# Patient Record
Sex: Male | Born: 1955 | Race: White | Hispanic: No | Marital: Married | Smoking: Former smoker
Health system: Southern US, Community
[De-identification: ages and names within clinical notes are randomized; demographics above are authoritative.]

## PROBLEM LIST (undated history)

## (undated) DIAGNOSIS — I10 Essential (primary) hypertension: Secondary | ICD-10-CM

## (undated) DIAGNOSIS — Z87442 Personal history of urinary calculi: Secondary | ICD-10-CM

## (undated) DIAGNOSIS — E7800 Pure hypercholesterolemia, unspecified: Secondary | ICD-10-CM

## (undated) DIAGNOSIS — N289 Disorder of kidney and ureter, unspecified: Secondary | ICD-10-CM

## (undated) DIAGNOSIS — G4730 Sleep apnea, unspecified: Secondary | ICD-10-CM

## (undated) HISTORY — PX: HERNIA REPAIR: SHX51

## (undated) HISTORY — PX: COLONOSCOPY: SHX174

## (undated) HISTORY — PX: BACK SURGERY: SHX140

---

## 2001-06-05 ENCOUNTER — Encounter: Payer: Self-pay | Admitting: Orthopaedic Surgery

## 2001-06-05 ENCOUNTER — Ambulatory Visit (HOSPITAL_COMMUNITY): Admission: RE | Admit: 2001-06-05 | Discharge: 2001-06-05 | Payer: Self-pay | Admitting: Orthopaedic Surgery

## 2001-06-16 ENCOUNTER — Encounter: Payer: Self-pay | Admitting: Neurosurgery

## 2001-06-17 ENCOUNTER — Inpatient Hospital Stay (HOSPITAL_COMMUNITY): Admission: RE | Admit: 2001-06-17 | Discharge: 2001-06-18 | Payer: Self-pay | Admitting: Neurosurgery

## 2001-06-23 ENCOUNTER — Encounter (HOSPITAL_COMMUNITY): Admission: RE | Admit: 2001-06-23 | Discharge: 2001-07-23 | Payer: Self-pay | Admitting: Neurosurgery

## 2005-11-27 ENCOUNTER — Ambulatory Visit (HOSPITAL_COMMUNITY): Admission: RE | Admit: 2005-11-27 | Discharge: 2005-11-27 | Payer: Self-pay | Admitting: Ophthalmology

## 2007-11-05 ENCOUNTER — Ambulatory Visit (HOSPITAL_COMMUNITY): Admission: RE | Admit: 2007-11-05 | Discharge: 2007-11-05 | Payer: Self-pay | Admitting: Ophthalmology

## 2011-01-12 ENCOUNTER — Emergency Department (HOSPITAL_BASED_OUTPATIENT_CLINIC_OR_DEPARTMENT_OTHER)
Admission: EM | Admit: 2011-01-12 | Discharge: 2011-01-12 | Disposition: A | Discharge status: Home / Self Care | Payer: No Typology Code available for payment source | Attending: Emergency Medicine | Admitting: Emergency Medicine

## 2011-01-12 ENCOUNTER — Emergency Department (INDEPENDENT_AMBULATORY_CARE_PROVIDER_SITE_OTHER): Payer: No Typology Code available for payment source

## 2011-01-12 DIAGNOSIS — M25539 Pain in unspecified wrist: Secondary | ICD-10-CM

## 2011-01-12 DIAGNOSIS — IMO0002 Reserved for concepts with insufficient information to code with codable children: Secondary | ICD-10-CM | POA: Insufficient documentation

## 2011-01-12 DIAGNOSIS — R079 Chest pain, unspecified: Secondary | ICD-10-CM

## 2011-01-12 DIAGNOSIS — Z79899 Other long term (current) drug therapy: Secondary | ICD-10-CM | POA: Insufficient documentation

## 2011-01-12 DIAGNOSIS — I10 Essential (primary) hypertension: Secondary | ICD-10-CM | POA: Insufficient documentation

## 2011-01-12 DIAGNOSIS — E7800 Pure hypercholesterolemia, unspecified: Secondary | ICD-10-CM | POA: Insufficient documentation

## 2011-01-12 DIAGNOSIS — Y92410 Unspecified street and highway as the place of occurrence of the external cause: Secondary | ICD-10-CM | POA: Insufficient documentation

## 2011-01-25 NOTE — Op Note (Signed)
[place]. [facility]  Patient:    Gregory Medina, Gregory Medina Visit Number: [id number] MRN: [id number]          Service Type: DSU Location: 3000 [phone] Attending Physician:  Danella Penton Dictated by:   Tanya Nones. Jeral Fruit, M.D. Proc. 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  PROCEDURES:  Right L5 hemilaminectomy, removal of large herniated disk at L4-5, total gross L4-5 diskectomies, decompression of the L5 and L4 nerve roots.  Microscope.  SURGEON:  Tanya Nones. Jeral Fruit, M.D.  ASSISTANTMena Goes. Franky Macho, M.D.  CLINICAL HISTORY:  Gregory Medina is a 55 year old gentleman who was seen in my office last week because of foot drop with a large herniated disk at the level of 4-5.  I told the patient about the need for surgery as soon as possible, but he declined surgery because of family problems.  He had no pain at that time, which I told him that that was a bad sign.  Nevertheless, he came yesterday in the office because he wanted surgery because of worsening of the weakness and pain.  The patient knew of the risks, such as infection, recurrence, and no improvement whatsoever, also the possibility of CSF leak and to deal with scar tissue from surgery that he had in the lumbar spine many years ago.  DESCRIPTION OF PROCEDURE:  The patient was taken to the OR, and the previous scar was removed after the skin was prepped with Betadine.  Incision was carried out in the midline until we were able to identify the area where he had surgery, which was 5-1.  We took an x-ray, which showed that indeed we were at the level of the lamina of L5.  Then because we knew by MRI the size of the herniated disk, we drilled the hemilamina of L5 and the upper part of L4.  The thecal sac was  retracted, but it was close to glued to the floor. Finally we were able to retract and identify the L4-5 space.  We opened the disk space, and we removed a large amount of degenerative disk.  Finally we were able to mobilize the thecal sac, and there was a large herniated free fragment compromising the takeoff of L5.  Having done total gross diskectomy, irrigation was achieved.  Valsalva maneuver was negative.  There was plenty of room for the L5 and L4 nerve roots.  Having done this, the area was closed with Vicryl and nylon.  The patient did well. Dictated by:   Tanya Nones. Jeral Fruit, M.D. Attending Physician:  Danella Penton DD:  06/16/01 TD:  06/17/01 Job: [phone] [id number]

## 2011-01-25 NOTE — Op Note (Signed)
NAMEEQUAN, COGBILL NO.:  [id number]   MEDICAL RECORD NO.:  [id number]          PATIENT TYPE:  AMB   LOCATION:  DAY                           FACILITY:  [facility]   PHYSICIAN:  Trish Fountain, MD    DATE OF BIRTH:  [birth year]   DATE OF PROCEDURE:  12/25/2005  DATE OF DISCHARGE:  11/27/2005                                 OPERATIVE REPORT   PREOPERATIVE DIAGNOSIS:  Cataract, right eye.   POSTOPERATIVE DIAGNOSIS:  Cataract, right eye.   SURGERY:  Kelman phacoemulsification, right eye, with posterior chamber  intraocular lens, right eye.   ANESTHESIA:  MAC with topical anesthesia of the right eye.   SURGEON:  Trish Fountain, MD   SPECIMENS:  None.   COMPLICATIONS:  None.   LENS MODEL:  AMO model ZA9003,  21.0  diopter lens, serial # [id number].   HISTORY:  This is a 55 year old male who has slowly progressive decrease in  vision in the right eye.   DESCRIPTION OF PROCEDURE:  In the preoperative area, the patient had  Cyclogyl and Neo-Synephrine drops in the right eye in order to dilate the  eye along with Tetracaine to help anesthetize the eye.  Once the patient's  right eye was dilated, the patient was taken to the operating room and  prepped.  The right eye was prepped and draped in the usual sterile manner.  A lid speculum was placed in the right eye, and 2% Xylocaine jelly was  placed in the right eye as well.  A paracentesis was made through clear  cornea at the limbus at approximately the 11 o'clock position of the right  eye.  Nonpreserved Xylocaine 1% 1 cc was placed into the anterior chamber  for one minute.  Viscoat was then used to fill the anterior chamber.  Using  a 2.75 mm blade at the 9 o'clock position, an incision into the anterior  chamber was made through clear cornea near the limbus.  Viscoat was again  used to reform the anterior chamber.  A 25 gauge bent capsulotomy needle was  used to begin the capsulorrhexis through the anterior  capsule of the lens.  Utrata forceps were used to make a 360 degree anterior capsulorrhexis.  A  Chang 27 gauge irrigating cannula was used to hydrodissect and  hydrodelineate the nucleus.  Once hydrodissection and hydrodelineation was  carried out, [facility] phacoemulsification was used to make a deep groove in  the lens nucleus.  The lens was rotated 360 degrees and divided into four  quadrants using deep grooves made by phacoemulsification with the [facility]  phacoemulsification tip.  The nucleus was then divided using the phaco tip  and the nucleus manipulator.  The nuclear quadrants were then removed using  phacoemulsification.  The irrigation aspiration was then used to remove the  remainder of the cortex.  A olive-tipped capsule polisher was used to clean  the posterior capsule.  Not all of the posterior capsular opacity could be  removed with the capsular polisher.  The anterior chamber and posterior  capsule was filled with Provisc, and  the 9 o'clock position incision was  slightly widened, using the same 2.75 mm blade that was initially used to  make the incision.  An intraocular lens was placed in the shooter, and this  was placed in the eye, followed by placement of the trailing haptic into the  posterior capsule, using the Kugelan.  Irrigation/aspiration was then used  to remove Provisc from the anterior chamber and the posterior capsule.  BSS  on a syringe was then used to hydrate the cornea at the 9 o'clock incision  site.  The incision site was then checked for water tightness, using a Weck-  cel.  Half-strength Betadine solution was placed, 1 drop, in the inner  canthus, and 1 drop in the outer canthus.  After one minute, this was rinsed  from the eye.  Drops were placed in the eye, Vigamox, followed by Nevanac  followed by Econopred.  A shield was placed over the patient's right eye,  and the patient was sent to the recovery room in satisfactory condition.      Trish Fountain, MD  Electronically Signed     PVK/MEDQ  D:  12/25/2005  T:  12/26/2005  Job:  [id number]

## 2011-01-25 NOTE — Discharge Summary (Signed)
[place]. [facility]  Patient:    Gregory Medina, ALA Visit Number: [id number] MRN: [id number]          Service Type: MED Location: [phone] Attending Physician:  Danella Penton Admit Date:  06/16/2001 Discharge Date: 06/18/2001                             Discharge Summary  ADMISSION DIAGNOSIS:  Right lumbar 4, lumbar 5 herniated disk with footdrop.  DISCHARGE DIAGNOSIS:  Right lumbar 4, lumbar 5 herniated disk with footdrop.  CLINICAL HISTORY:  The patient is admitted because of back and right leg pain associated a footdrop.  I saw him a week ago, but he declined admission because of family problems.  Patient had previous surgery by somebody else many years ago.  COURSE IN THE HOSPITAL:  The patient was taken to surgery where an L4, L5 diskectomy was performed.  The patients pain improved and the weakness improved slightly.  Twenty-four hours later he developed fever.  He was treated with a hand held nebulizer and right now he is afebrile.  That is the main reason he was kept for 48 hours.  We were informed that the chest x-ray showed a shadow in the right apex and he is going to have a chest CT scan as an outpatient.  CONDITION ON DISCHARGE:  Improved.  MEDICATIONS:  Percocet and diazepam.  FOLLOW-UP:  I will see him in my office next week.  He will have an appointment to be seen by physical therapy for therapy of the right foot as well as an outpatient chest CT scan.  DIET:  Regular. Attending Physician:  Danella Penton DD:  06/18/01 TD:  06/18/01 Job: [phone]

## 2011-01-25 NOTE — H&P (Signed)
[place]. [facility]  Patient:    Gregory Medina, Gregory Medina Visit Number: [id number] MRN: [id number]          Service Type: DSU Location: 3000 [phone] Attending Physician:  Danella Penton Dictated by:   Tanya Nones. Jeral Fruit, M.D. Admit Date:  06/16/2001                           History and Physical  CHIEF COMPLAINT: Mr. Lefferts is a patient who was seen by me as an emergency in my office about a week ago because of sudden onset of weakness of the right foot.  HISTORY OF PRESENT ILLNESS: The patient had surgery about 13 years ago in [place], [place], and he did really well.  In August 2002 he was water skiing and suddenly developed back pain and later on pain and weakness in the right foot.  He had an MRI and was sent to [place] for evaluation.  PAST MEDICAL HISTORY:  1. Surgery at the level of 5-1 in 1988.  2. Epidural injection in 1990.  ALLERGIES: MOTRIN.  SOCIAL HISTORY: He drinks socially.  He does not smoke.  FAMILY HISTORY: Negative.  REVIEW OF SYSTEMS: Only positive for back pain and weakness.  PHYSICAL EXAMINATION:  GENERAL: The patient came to my office and was limping from the right leg.  HEENT: Normal.  NECK: Normal.  LUNGS: Clear.  CARDIAC: Heart sounds normal.  ABDOMEN: Normal.  EXTREMITIES: Normal pulses.  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  NEUROLOGIC: Mental status normal.  Cranial nerves normal.  Strength is 5/5 except in the left foot, where he has 4/5 plantar flexion and 0-1/5 dorsiflexion.  There is no atrophy or fasciculation.  Sensation normal. Reflexes symmetrical.  LABORATORY DATA: MRI showed he has a large herniated disk at the level at 4-5 with displacement of the thecal sac on the right side.  There are degenerative changes at the level of 5-1.  CLINICAL IMPRESSION: Foot drop on the right side associated with herniated disk at 4-5 on the right.  PLAN: I talked with the  patient about the meaning of the foot drop. Nevertheless, the patient decided to wait for surgery.  He is being taken today for surgery and he knows about the risks such as infection, no improvement whatsoever of the weakness, need for further surgery, and CSF leak.Dictated by:   Tanya Nones. Jeral Fruit, M.D. Attending Physician:  Danella Penton DD:  06/16/01 TD:  06/16/01 Job: [phone] [id number]

## 2011-05-31 LAB — BASIC METABOLIC PANEL
BUN: 15
CO2: 30
Calcium: 9.1
Chloride: 106
Creatinine, Ser: 0.85
GFR calc Af Amer: >60
GFR calc non Af Amer: >60
Glucose, Bld: 101 — ABNORMAL HIGH
Potassium: 4.9
Sodium: 140

## 2012-10-19 ENCOUNTER — Emergency Department (HOSPITAL_COMMUNITY)
Admission: EM | Admit: 2012-10-19 | Discharge: 2012-10-19 | Disposition: A | Discharge status: Home / Self Care | Payer: Managed Care, Other (non HMO) | Attending: Emergency Medicine | Admitting: Emergency Medicine

## 2012-10-19 ENCOUNTER — Encounter (HOSPITAL_COMMUNITY): Payer: Self-pay | Admitting: *Deleted

## 2012-10-19 ENCOUNTER — Emergency Department (HOSPITAL_COMMUNITY): Payer: Managed Care, Other (non HMO)

## 2012-10-19 DIAGNOSIS — R112 Nausea with vomiting, unspecified: Secondary | ICD-10-CM | POA: Insufficient documentation

## 2012-10-19 DIAGNOSIS — Z87442 Personal history of urinary calculi: Secondary | ICD-10-CM | POA: Insufficient documentation

## 2012-10-19 DIAGNOSIS — N201 Calculus of ureter: Secondary | ICD-10-CM | POA: Insufficient documentation

## 2012-10-19 DIAGNOSIS — Z79899 Other long term (current) drug therapy: Secondary | ICD-10-CM | POA: Insufficient documentation

## 2012-10-19 DIAGNOSIS — Z7982 Long term (current) use of aspirin: Secondary | ICD-10-CM | POA: Insufficient documentation

## 2012-10-19 DIAGNOSIS — N23 Unspecified renal colic: Secondary | ICD-10-CM | POA: Insufficient documentation

## 2012-10-19 DIAGNOSIS — I10 Essential (primary) hypertension: Secondary | ICD-10-CM | POA: Insufficient documentation

## 2012-10-19 DIAGNOSIS — Z8719 Personal history of other diseases of the digestive system: Secondary | ICD-10-CM | POA: Insufficient documentation

## 2012-10-19 HISTORY — DX: Essential (primary) hypertension: I10

## 2012-10-19 HISTORY — DX: Disorder of kidney and ureter, unspecified: N28.9

## 2012-10-19 LAB — URINALYSIS, ROUTINE W REFLEX MICROSCOPIC
Glucose, UA: 100 mg/dL — AB
Ketones, ur: NEGATIVE mg/dL
Leukocytes, UA: NEGATIVE
pH: 7.5 (ref 5.0–8.0)

## 2012-10-19 LAB — URINE MICROSCOPIC-ADD ON

## 2012-10-19 MED ORDER — ONDANSETRON HCL 4 MG/2ML IJ SOLN
4.0000 mg | Freq: Once | INTRAMUSCULAR | Status: AC
  Administered 2012-10-19: 4 mg via INTRAVENOUS
  Filled 2012-10-19: qty 2

## 2012-10-19 MED ORDER — ONDANSETRON HCL 4 MG/2ML IJ SOLN
INTRAMUSCULAR | Status: AC
  Administered 2012-10-19: 4 mg
  Filled 2012-10-19: qty 2

## 2012-10-19 MED ORDER — OXYCODONE-ACETAMINOPHEN 5-325 MG PO TABS: 1.0000 | ORAL_TABLET | ORAL | Status: DC | PRN

## 2012-10-19 MED ORDER — HYDROMORPHONE HCL PF 1 MG/ML IJ SOLN
1.0000 mg | Freq: Once | INTRAMUSCULAR | Status: AC
  Administered 2012-10-19: 1 mg via INTRAVENOUS
  Filled 2012-10-19: qty 1

## 2012-10-19 MED ORDER — HYDROMORPHONE HCL PF 1 MG/ML IJ SOLN
INTRAMUSCULAR | Status: AC
  Administered 2012-10-19: 1 mg
  Filled 2012-10-19: qty 1

## 2012-10-19 MED ORDER — KETOROLAC TROMETHAMINE 30 MG/ML IJ SOLN
30.0000 mg | Freq: Once | INTRAMUSCULAR | Status: AC
  Administered 2012-10-19: 30 mg via INTRAVENOUS
  Filled 2012-10-19: qty 1

## 2012-10-19 MED ORDER — TAMSULOSIN HCL 0.4 MG PO CAPS: ORAL_CAPSULE | ORAL | Status: DC

## 2012-10-19 MED ORDER — TAMSULOSIN HCL 0.4 MG PO CAPS
ORAL_CAPSULE | ORAL | Status: AC
  Filled 2012-10-19: qty 1

## 2012-10-19 MED ORDER — TAMSULOSIN HCL 0.4 MG PO CAPS
0.4000 mg | ORAL_CAPSULE | Freq: Once | ORAL | Status: AC
  Administered 2012-10-19: 0.4 mg via ORAL
  Filled 2012-10-19: qty 1

## 2012-10-19 MED ORDER — SODIUM CHLORIDE 0.9 % IV SOLN
INTRAVENOUS | Status: DC
  Administered 2012-10-19: 05:00:00 via INTRAVENOUS

## 2012-10-19 NOTE — ED Notes (Signed)
Pt c/o left sided flank pain, and n/v. Pt has had a kidney stone before.

## 2012-10-19 NOTE — ED Provider Notes (Signed)
History     CSN: [id number]  Arrival date & time 10/19/12  4098   First MD Initiated Contact with Patient 10/19/12 0505      Chief Complaint  Patient presents with  . Flank Pain  . Nausea  . Emesis    (Consider location/radiation/quality/duration/timing/severity/associated sxs/prior treatment) HPI Comments: Gregory Medina is a 57 y.o. Male who is here for evaluation of, sudden onset of severe, left flank, radiating to left abdomen,  pain. The pain is pressure-like. It is similar to when he last had a kidney stone, 15 years ago. He has not tried a medication for the problem. There are no associated symptoms. The pain is unrelenting since it started. There are no known modifying factors  Patient is a 57 y.o. male presenting with flank pain and vomiting. The history is provided by the patient.  Flank Pain  Emesis   Past Medical History  Diagnosis Date  . Hypertension   . Renal disorder     kidney stone    Past Surgical History  Procedure Laterality Date  . Hernia repair    . Back surgery      History reviewed. No pertinent family history.  History  Substance Use Topics  . Smoking status: Never Smoker   . Smokeless tobacco: Not on file  . Alcohol Use: Yes     Comment: occasionally      Review of Systems  Gastrointestinal: Positive for vomiting.  Genitourinary: Positive for flank pain.  All other systems reviewed and are negative.    Allergies  Review of patient's allergies indicates no known allergies.  Home Medications   Current Outpatient Rx  Name  Route  Sig  Dispense  Refill  . aspirin 81 MG tablet   Oral   Take 81 mg by mouth daily.         . cyclobenzaprine (FLEXERIL) 10 MG tablet   Oral   Take 10 mg by mouth as needed for muscle spasms.         . fish oil-omega-3 fatty acids 1000 MG capsule   Oral   Take 1 g by mouth daily.         Marland Kitchen lisinopril (PRINIVIL,ZESTRIL) 20 MG tablet   Oral   Take 20 mg by mouth daily.         .  metoprolol succinate (TOPROL-XL) 100 MG 24 hr tablet   Oral   Take 100 mg by mouth daily. Take with or immediately following a meal.         . Multiple Vitamin (MULTIVITAMIN) tablet   Oral   Take 1 tablet by mouth daily.         . pravastatin (PRAVACHOL) 20 MG tablet   Oral   Take 20 mg by mouth at bedtime.         Marland Kitchen oxyCODONE-acetaminophen (PERCOCET) 5-325 MG per tablet   Oral   Take 1 tablet by mouth every 4 (four) hours as needed for pain.   20 tablet   0   . Tamsulosin HCl (FLOMAX) 0.4 MG CAPS      1 q HS to aid stone passage   7 capsule   0     BP 125/72  Pulse 60  Temp(Src) 97.6 F (36.4 C) (Oral)  Resp 12  Ht 5\' 10"  (1.778 m)  Wt 190 lb (86.183 kg)  BMI 27.26 kg/m2  SpO2 97%  Physical Exam  Nursing note and vitals reviewed. Constitutional: He is oriented to person, place, and  time. He appears well-developed and well-nourished.  HENT:  Head: Normocephalic and atraumatic.  Right Ear: External ear normal.  Left Ear: External ear normal.  Eyes: Conjunctivae and EOM are normal. Pupils are equal, round, and reactive to light.  Neck: Normal range of motion and phonation normal. Neck supple.  Cardiovascular: Normal rate, regular rhythm, normal heart sounds and intact distal pulses.   Pulmonary/Chest: Effort normal and breath sounds normal. He exhibits no bony tenderness.  Abdominal: Soft. Normal appearance. There is no tenderness.  Musculoskeletal: Normal range of motion.  Neurological: He is alert and oriented to person, place, and time. He has normal strength. No cranial nerve deficit or sensory deficit. He exhibits normal muscle tone. Coordination normal.  Skin: Skin is warm, dry and intact.  Psychiatric: He has a normal mood and affect. His behavior is normal. Judgment and thought content normal.    ED Course  Procedures (including critical care time)  Initial treatment, IV, Dilaudid, and Zofran  He had recurrence of pain. He required, a second dose  of Dilaudid; he became mildly hypoxic to 85% after the second, Dilaudid dose. Nasal cannula oxygen improved, his oxygen saturation to 94%  CT images reviewed by me dictating a small distal Left ureteral stone. IV, Toradol, and oral Flomax, ordered    Labs Reviewed  URINALYSIS, ROUTINE W REFLEX MICROSCOPIC - Abnormal; Notable for the following:    Glucose, UA 100 (*)    Hgb urine dipstick LARGE (*)    All other components within normal limits  URINE MICROSCOPIC-ADD ON   Ct Abdomen Pelvis Wo Contrast  10/19/2012  *RADIOLOGY REPORT*  Clinical Data: Left-sided flank pain for 3 hours.  Nausea and vomiting.  CT ABDOMEN AND PELVIS WITHOUT CONTRAST  Technique:  Multidetector CT imaging of the abdomen and pelvis was performed following the standard protocol without intravenous contrast.  Comparison: None.  Findings: The visualized lung bases are clear.  The liver and spleen are unremarkable in appearance.  The gallbladder is within normal limits.  The pancreas and adrenal glands are unremarkable.  There is mild left-sided hydronephrosis, with left-sided perinephric stranding and fluid, and prominence of the left ureter along its entire course.  An obstructing 3 mm stone is noted distally, just proximal to the left vesicoureteral junction.  Slight irregularity involving the lower pole of the right kidney may reflect a small angiomyolipoma.  The right kidney is otherwise unremarkable in appearance.  An exophytic 2.2 cm cyst is noted arising at the upper pole of the left kidney.  A 2 mm stone is noted near the interpole region of the left kidney.  No free fluid is identified.  The small bowel is unremarkable in appearance.  A tiny hiatal hernia is noted; the stomach is otherwise unremarkable.  No acute vascular abnormalities are seen. Mild scattered calcification is noted along the distal abdominal aorta and its branches.  The appendix is normal in caliber, without evidence for appendicitis.  The colon is  unremarkable in appearance.  The bladder is decompressed and not well assessed.  A small right inguinal hernia is noted, containing only fat.  The prostate is borderline normal in size, with scattered calcification.  No inguinal lymphadenopathy is seen.  No acute osseous abnormalities are identified.  Vacuum phenomenon and disc space narrowing are noted at L5-S1.  IMPRESSION:  1.  Mild left-sided hydronephrosis, with an obstructing 3 mm distal ureteral stone seen, just proximal to the left vesicoureteral junction. 2.  Question of small right-sided renal angiomyolipoma.  Left renal cyst noted.  2 mm stone near the interpole region of the left kidney.  3.  Tiny hiatal hernia seen. 4.  Mild scattered calcification along the distal abdominal aorta and its branches. 5.  Small right inguinal hernia, containing only fat.   Original Report Authenticated By: Tonia Ghent, M.D.    Nursing notes, applicable records and vitals reviewed.  Radiologic Images/Reports reviewed.   1. Ureteral stone   2. Ureteral colic       MDM  Partially obstructing left ureter stone with high likelihood of passage, within the next 72 hours.      Plan: Home Medications- Percocet, Flomax; Home Treatments- strain urine; Recommended follow up- urology One week    Flint Melter, MD 10/19/12 [id number]

## 2012-10-19 NOTE — ED Notes (Signed)
Patient oxygen saturation dropped to 85% on room air. Patient placed on 2 liters of oxygen via nasal canula.

## 2012-11-12 ENCOUNTER — Other Ambulatory Visit (HOSPITAL_COMMUNITY): Payer: Self-pay | Admitting: Family Medicine

## 2012-11-12 DIAGNOSIS — K769 Liver disease, unspecified: Secondary | ICD-10-CM

## 2012-11-13 ENCOUNTER — Other Ambulatory Visit (HOSPITAL_COMMUNITY): Payer: Managed Care, Other (non HMO)

## 2012-11-17 ENCOUNTER — Ambulatory Visit (HOSPITAL_COMMUNITY)
Admission: RE | Admit: 2012-11-17 | Discharge: 2012-11-17 | Disposition: A | Discharge status: Home / Self Care | Payer: Managed Care, Other (non HMO) | Source: Ambulatory Visit | Attending: Family Medicine | Admitting: Family Medicine

## 2012-11-17 DIAGNOSIS — K7689 Other specified diseases of liver: Secondary | ICD-10-CM | POA: Insufficient documentation

## 2012-11-17 DIAGNOSIS — Q619 Cystic kidney disease, unspecified: Secondary | ICD-10-CM | POA: Insufficient documentation

## 2012-11-17 DIAGNOSIS — K769 Liver disease, unspecified: Secondary | ICD-10-CM

## 2012-11-17 LAB — POCT I-STAT, CHEM 8
Chloride: 106 mEq/L (ref 96–112)
HCT: 43 % (ref 39.0–52.0)
Potassium: 3.8 mEq/L (ref 3.5–5.1)

## 2012-11-17 MED ORDER — GADOBENATE DIMEGLUMINE 529 MG/ML IV SOLN
18.0000 mL | Freq: Once | INTRAVENOUS | Status: AC | PRN
  Administered 2012-11-17: 18 mL via INTRAVENOUS

## 2012-11-17 NOTE — Progress Notes (Signed)
Blood sample obtained from right arm IV for Creatnine level.  

## 2014-04-26 IMAGING — CT CT ABD-PELV W/O CM
2 of 3 series · 15 of 46 positions shown, 17 images · non-contrast
Comparison: None.

***ADDENDUM*** CREATED: 11/03/2012 [DATE]

Omitted from initial dictation is presence of a 2.7 x 1.8 x 2.1 cm
diameter low attenuation lesion within the posterior aspect of the
right lobe liver image 34.  This lesion measures 33 HU attenuation
and is indeterminate in etiology.  Further characterization of this
lesion by MR imaging with/without contrast is recommended.
CLINICAL DATA: Left-sided flank pain for 3 hours.  Nausea and
vomiting.
CT ABDOMEN AND PELVIS WITHOUT CONTRAST
TECHNIQUE: Multidetector CT imaging of the abdomen and pelvis was
performed following the standard protocol without intravenous
contrast.

[Series 2: standard/full over (age)lbs 5.0 · axial · 0.68mm/px · z∈[-648,-183]mm · 12 of 107 slices shown, 14 images]
[im 7/107  soft-tissue]
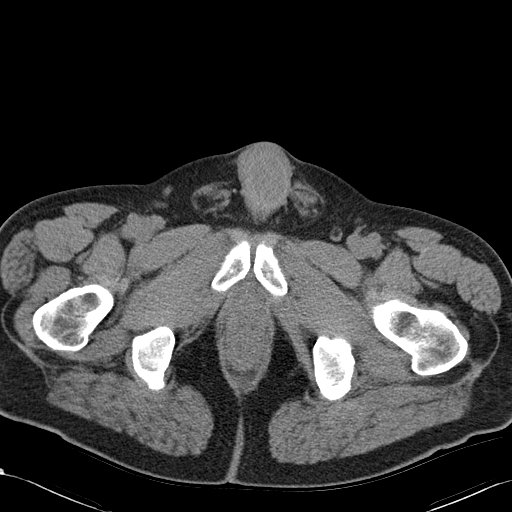
[im 7/107  bone]
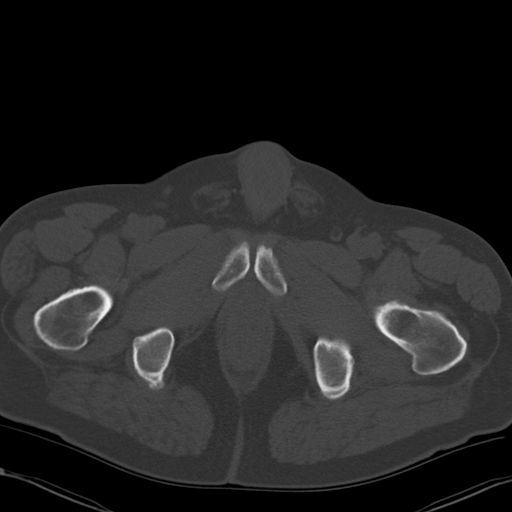
[im 14/107  soft-tissue]
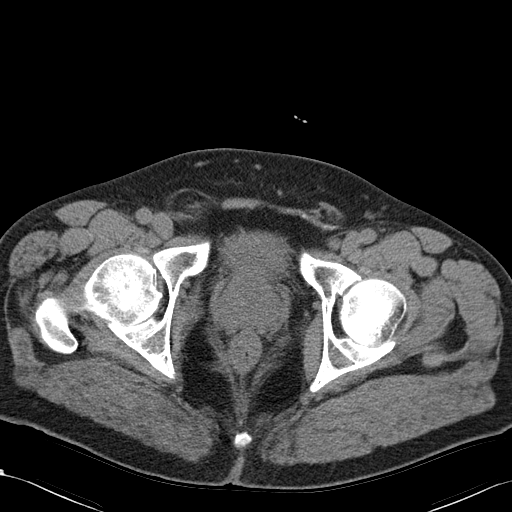
[im 24/107  soft-tissue]
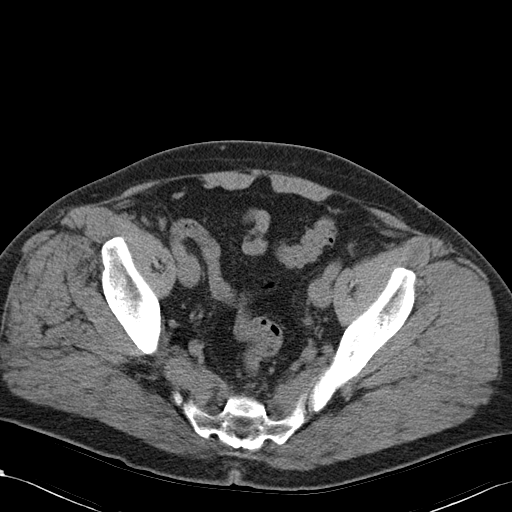
[im 31/107  soft-tissue]
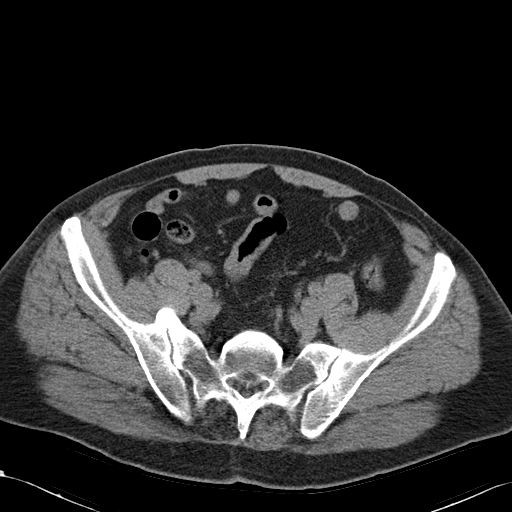
[im 42/107  soft-tissue]
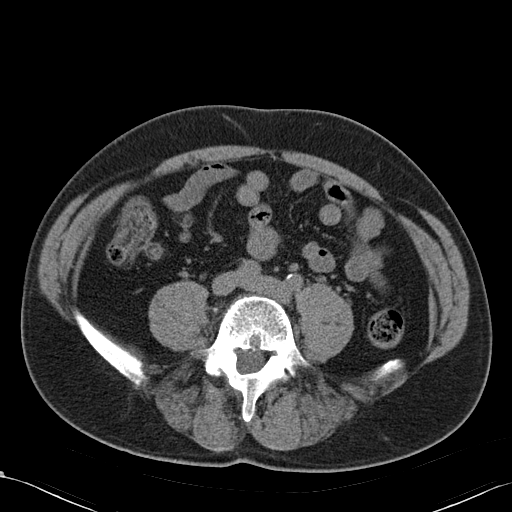
[im 48/107  soft-tissue]
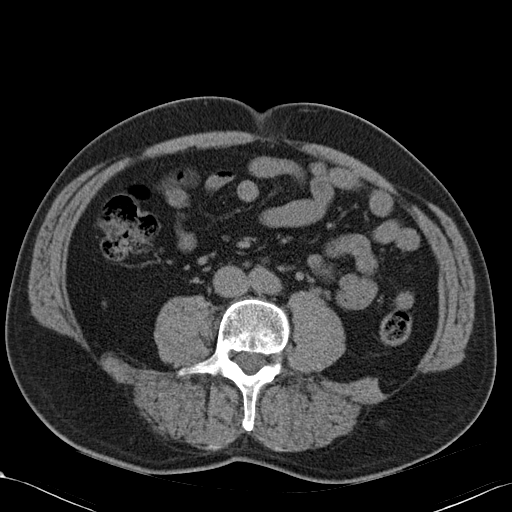
[im 59/107  soft-tissue]
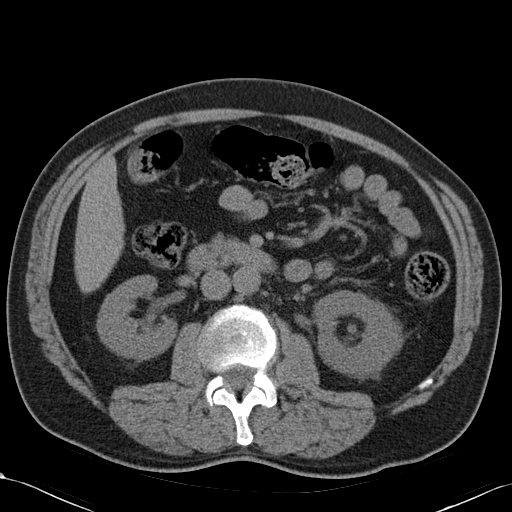
[im 65/107  soft-tissue]
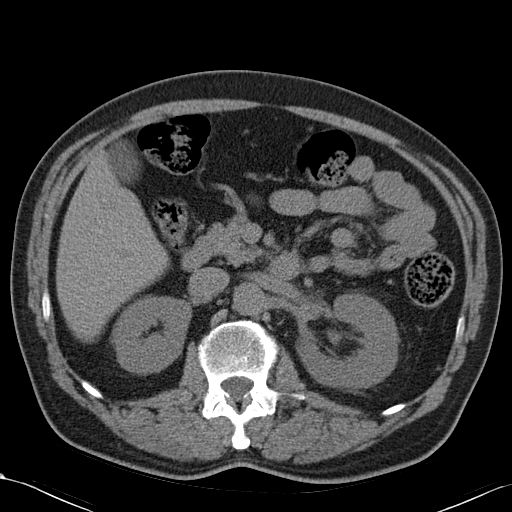
[im 76/107  soft-tissue]
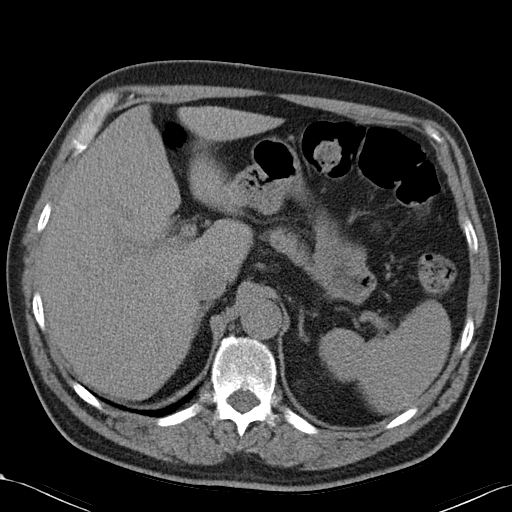
[im 76/107  bone]
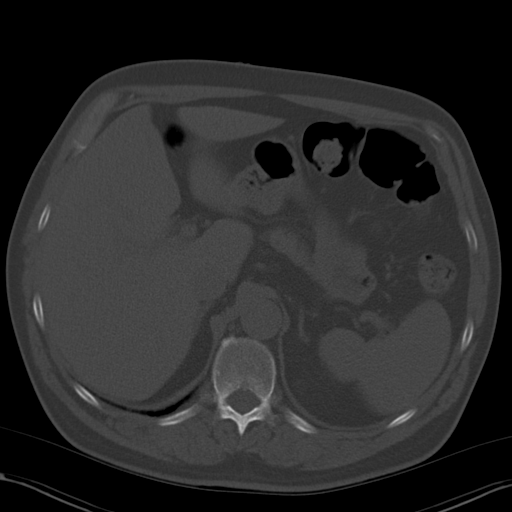
[im 83/107  soft-tissue]
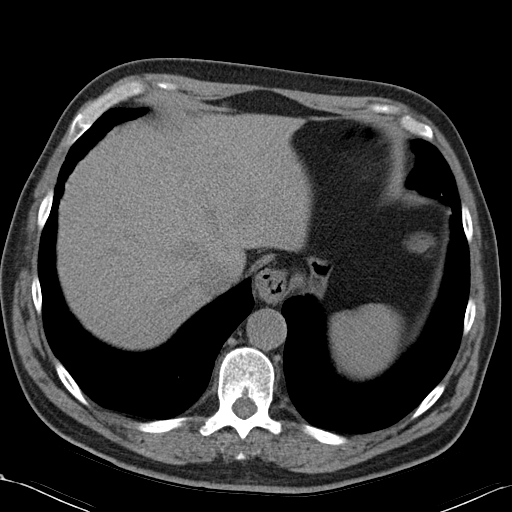
[im 93/107  soft-tissue]
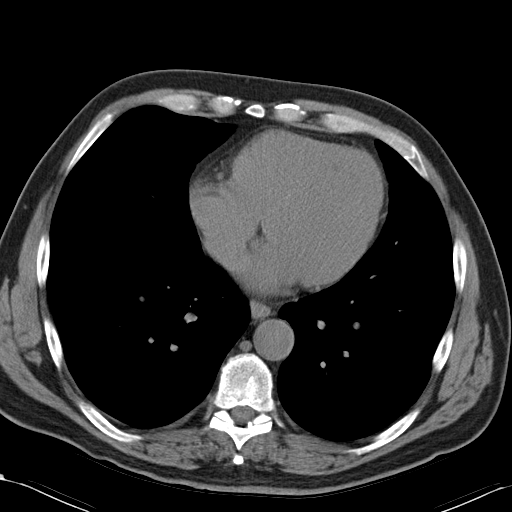
[im 100/107  soft-tissue]
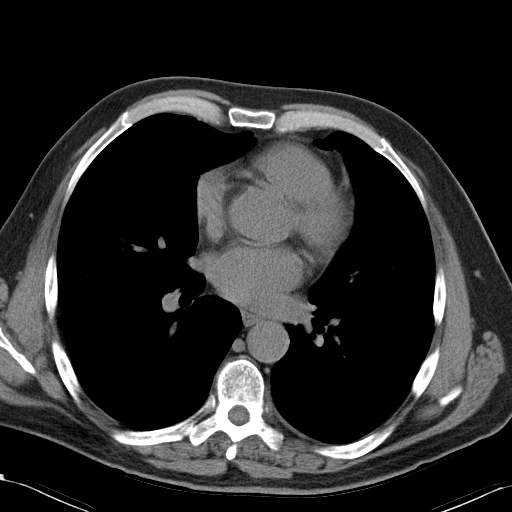

[Series 4: mpr coronal · coronal · 0.79mm/px · 3 of 102 slices shown]
[im 34/102  soft-tissue]
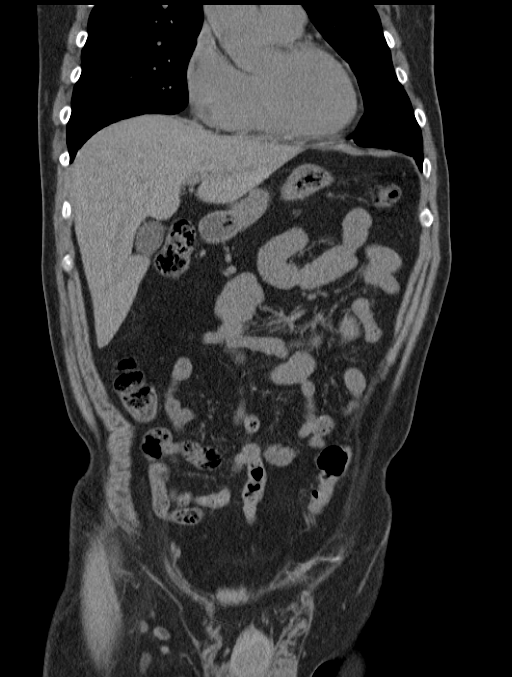
[im 45/102  soft-tissue]
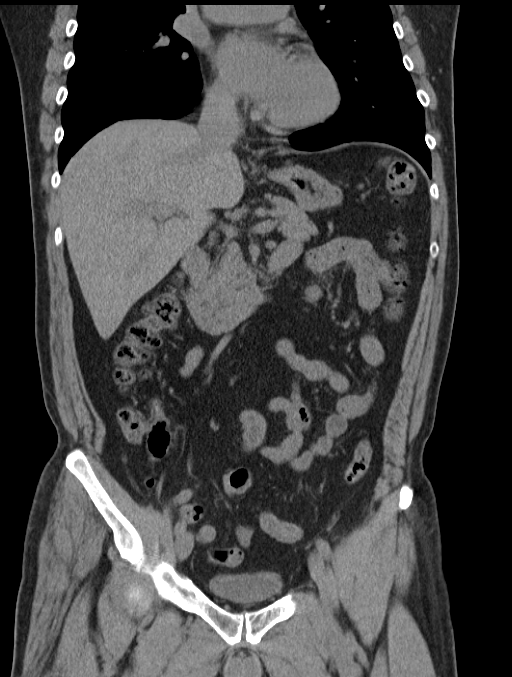
[im 57/102  soft-tissue]
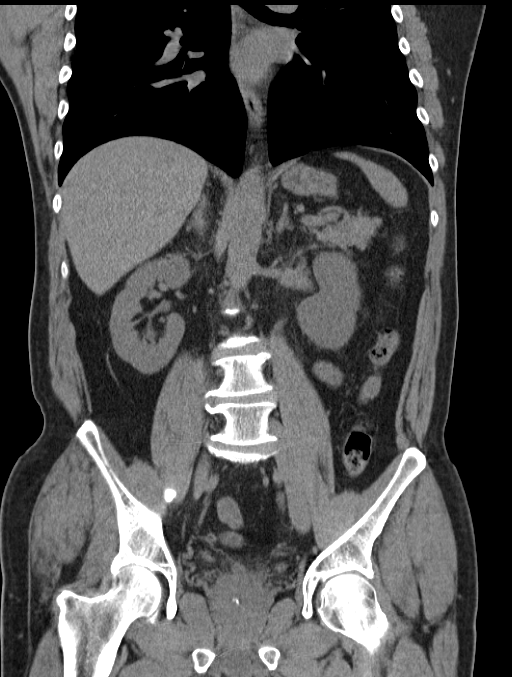

[15 of 46 positions shown; findings below may reference images not displayed]

FINDINGS: The visualized lung bases are clear.

The liver and spleen are unremarkable in appearance.  The
gallbladder is within normal limits.  The pancreas and adrenal
glands are unremarkable.

There is mild left-sided hydronephrosis, with left-sided
perinephric stranding and fluid, and prominence of the left ureter
along its entire course.  An obstructing 3 mm stone is noted
distally, just proximal to the left vesicoureteral junction.

Slight irregularity involving the lower pole of the right kidney
may reflect a small angiomyolipoma.  The right kidney is otherwise
unremarkable in appearance.  An exophytic 2.2 cm cyst is noted
arising at the upper pole of the left kidney.  A 2 mm stone is
noted near the interpole region of the left kidney.

No free fluid is identified.  The small bowel is unremarkable in
appearance.  A tiny hiatal hernia is noted; the stomach is
otherwise unremarkable.  No acute vascular abnormalities are seen.
Mild scattered calcification is noted along the distal abdominal
aorta and its branches.

The appendix is normal in caliber, without evidence for
appendicitis.  The colon is unremarkable in appearance.

The bladder is decompressed and not well assessed.  A small right
inguinal hernia is noted, containing only fat.  The prostate is
borderline normal in size, with scattered calcification.  No
inguinal lymphadenopathy is seen.

No acute osseous abnormalities are identified.  Vacuum phenomenon
and disc space narrowing are noted at L5-S1.
IMPRESSION: 1.  Mild left-sided hydronephrosis, with an obstructing 3 mm distal
ureteral stone seen, just proximal to the left vesicoureteral
junction.
2.  Question of small right-sided renal angiomyolipoma.  Left renal
cyst noted.  2 mm stone near the interpole region of the left
kidney.

3.  Tiny hiatal hernia seen.
4.  Mild scattered calcification along the distal abdominal aorta
and its branches.
5.  Small right inguinal hernia, containing only fat.

## 2017-11-24 MED FILL — METOPROLOL SUCCINATE ER 100: 100 | 90 days supply | Qty: 90 | Fill #0

## 2017-11-24 MED FILL — PRAVASTATIN SODIUM 20 MG TA: 20 | 90 days supply | Qty: 90 | Fill #0

## 2017-12-22 MED FILL — LISINOPRIL 20 MG TABLET: 20 | 90 days supply | Qty: 90 | Fill #0

## 2018-01-22 DIAGNOSIS — Z0001 Encounter for general adult medical examination with abnormal findings: Secondary | ICD-10-CM | POA: Diagnosis not present

## 2018-01-22 DIAGNOSIS — G4733 Obstructive sleep apnea (adult) (pediatric): Secondary | ICD-10-CM | POA: Diagnosis not present

## 2018-01-22 DIAGNOSIS — R7301 Impaired fasting glucose: Secondary | ICD-10-CM | POA: Diagnosis not present

## 2018-01-22 DIAGNOSIS — E782 Mixed hyperlipidemia: Secondary | ICD-10-CM | POA: Diagnosis not present

## 2018-01-22 DIAGNOSIS — E7800 Pure hypercholesterolemia, unspecified: Secondary | ICD-10-CM | POA: Diagnosis not present

## 2018-01-26 DIAGNOSIS — E782 Mixed hyperlipidemia: Secondary | ICD-10-CM | POA: Diagnosis not present

## 2018-01-26 DIAGNOSIS — Z6829 Body mass index (BMI) 29.0-29.9, adult: Secondary | ICD-10-CM | POA: Diagnosis not present

## 2018-01-26 DIAGNOSIS — I10 Essential (primary) hypertension: Secondary | ICD-10-CM | POA: Diagnosis not present

## 2018-01-26 DIAGNOSIS — Z0001 Encounter for general adult medical examination with abnormal findings: Secondary | ICD-10-CM | POA: Diagnosis not present

## 2018-02-23 MED FILL — PRAVASTATIN SODIUM 20 MG TA: 20 | 90 days supply | Qty: 90 | Fill #1

## 2018-02-23 MED FILL — METOPROLOL SUCCINATE ER 100: 100 | 90 days supply | Qty: 90 | Fill #1

## 2018-03-23 MED FILL — LISINOPRIL 20 MG TABLET: 20 | 90 days supply | Qty: 90 | Fill #1

## 2018-05-19 MED FILL — PRAVASTATIN SODIUM 20 MG TA: 20 | 90 days supply | Qty: 90 | Fill #2

## 2018-05-19 MED FILL — METOPROLOL SUCCINATE ER 100: 100 | 90 days supply | Qty: 90 | Fill #2

## 2018-06-22 MED FILL — LISINOPRIL 20 MG TABLET: 20 | 90 days supply | Qty: 90 | Fill #2

## 2018-07-13 DIAGNOSIS — Z23 Encounter for immunization: Secondary | ICD-10-CM | POA: Diagnosis not present

## 2018-08-05 DIAGNOSIS — H0102B Squamous blepharitis left eye, upper and lower eyelids: Secondary | ICD-10-CM | POA: Diagnosis not present

## 2018-08-05 DIAGNOSIS — H31002 Unspecified chorioretinal scars, left eye: Secondary | ICD-10-CM | POA: Diagnosis not present

## 2018-08-05 DIAGNOSIS — H0102A Squamous blepharitis right eye, upper and lower eyelids: Secondary | ICD-10-CM | POA: Diagnosis not present

## 2018-08-05 DIAGNOSIS — H11433 Conjunctival hyperemia, bilateral: Secondary | ICD-10-CM | POA: Diagnosis not present

## 2018-08-24 MED FILL — METOPROLOL SUCCINATE ER 100: 100 | 30 days supply | Qty: 30 | Fill #3

## 2018-08-24 MED FILL — PRAVASTATIN SODIUM 20 MG TA: 20 | 30 days supply | Qty: 30 | Fill #3

## 2018-08-28 DIAGNOSIS — H0102A Squamous blepharitis right eye, upper and lower eyelids: Secondary | ICD-10-CM | POA: Diagnosis not present

## 2018-08-28 DIAGNOSIS — H0102B Squamous blepharitis left eye, upper and lower eyelids: Secondary | ICD-10-CM | POA: Diagnosis not present

## 2018-09-21 MED FILL — METOPROLOL SUCCINATE ER 100: 100 | 90 days supply | Qty: 90 | Fill #0

## 2018-09-21 MED FILL — LISINOPRIL 20 MG TABLET: 20 | 90 days supply | Qty: 90 | Fill #0

## 2018-09-21 MED FILL — PRAVASTATIN SODIUM 20 MG TA: 20 | 90 days supply | Qty: 90 | Fill #0

## 2018-12-09 MED FILL — PRAVASTATIN SODIUM 20 MG TA: 20 | 90 days supply | Qty: 90 | Fill #0

## 2018-12-09 MED FILL — LISINOPRIL 20 MG TABLET: 20 | 90 days supply | Qty: 90 | Fill #0

## 2018-12-09 MED FILL — METOPROLOL SUCCINATE ER 100: 100 | 90 days supply | Qty: 90 | Fill #0

## 2019-01-18 DIAGNOSIS — H0102B Squamous blepharitis left eye, upper and lower eyelids: Secondary | ICD-10-CM | POA: Diagnosis not present

## 2019-01-18 DIAGNOSIS — H0102A Squamous blepharitis right eye, upper and lower eyelids: Secondary | ICD-10-CM | POA: Diagnosis not present

## 2019-01-29 DIAGNOSIS — E7800 Pure hypercholesterolemia, unspecified: Secondary | ICD-10-CM | POA: Diagnosis not present

## 2019-01-29 DIAGNOSIS — R7301 Impaired fasting glucose: Secondary | ICD-10-CM | POA: Diagnosis not present

## 2019-01-29 DIAGNOSIS — K210 Gastro-esophageal reflux disease with esophagitis: Secondary | ICD-10-CM | POA: Diagnosis not present

## 2019-01-29 DIAGNOSIS — I10 Essential (primary) hypertension: Secondary | ICD-10-CM | POA: Diagnosis not present

## 2019-01-29 DIAGNOSIS — Z125 Encounter for screening for malignant neoplasm of prostate: Secondary | ICD-10-CM | POA: Diagnosis not present

## 2019-01-29 DIAGNOSIS — G4733 Obstructive sleep apnea (adult) (pediatric): Secondary | ICD-10-CM | POA: Diagnosis not present

## 2019-01-29 DIAGNOSIS — E782 Mixed hyperlipidemia: Secondary | ICD-10-CM | POA: Diagnosis not present

## 2019-02-03 DIAGNOSIS — Z6828 Body mass index (BMI) 28.0-28.9, adult: Secondary | ICD-10-CM | POA: Diagnosis not present

## 2019-02-03 DIAGNOSIS — Z0001 Encounter for general adult medical examination with abnormal findings: Secondary | ICD-10-CM | POA: Diagnosis not present

## 2019-02-03 DIAGNOSIS — I10 Essential (primary) hypertension: Secondary | ICD-10-CM | POA: Diagnosis not present

## 2019-02-03 DIAGNOSIS — E782 Mixed hyperlipidemia: Secondary | ICD-10-CM | POA: Diagnosis not present

## 2019-03-03 MED FILL — METOPROLOL SUCCINATE ER 100: 100 | 90 days supply | Qty: 90 | Fill #1

## 2019-03-03 MED FILL — LISINOPRIL 20 MG TABLET: 20 | 90 days supply | Qty: 90 | Fill #1

## 2019-03-03 MED FILL — PRAVASTATIN SODIUM 20 MG TA: 20 | 90 days supply | Qty: 90 | Fill #1

## 2019-06-01 MED FILL — METOPROLOL SUCCINATE ER 100: 100 | 90 days supply | Qty: 90 | Fill #2

## 2019-06-01 MED FILL — PRAVASTATIN SODIUM 20 MG TA: 20 | 90 days supply | Qty: 90 | Fill #2

## 2019-06-01 MED FILL — LISINOPRIL 20 MG TABLET: 20 | 90 days supply | Qty: 90 | Fill #2

## 2019-07-26 DIAGNOSIS — H11433 Conjunctival hyperemia, bilateral: Secondary | ICD-10-CM | POA: Diagnosis not present

## 2019-07-26 DIAGNOSIS — H31002 Unspecified chorioretinal scars, left eye: Secondary | ICD-10-CM | POA: Diagnosis not present

## 2019-07-26 DIAGNOSIS — H0102B Squamous blepharitis left eye, upper and lower eyelids: Secondary | ICD-10-CM | POA: Diagnosis not present

## 2019-07-26 DIAGNOSIS — H0102A Squamous blepharitis right eye, upper and lower eyelids: Secondary | ICD-10-CM | POA: Diagnosis not present

## 2019-08-30 MED FILL — METOPROLOL SUCCINATE ER 100: 100 | 90 days supply | Qty: 90 | Fill #0

## 2019-08-30 MED FILL — LISINOPRIL 20 MG TABLET: 20 | 90 days supply | Qty: 90 | Fill #0

## 2019-08-30 MED FILL — PRAVASTATIN SODIUM 20 MG TA: 20 | 90 days supply | Qty: 90 | Fill #0

## 2019-11-17 MED FILL — METOPROLOL SUCCINATE ER 100: 100 | 90 days supply | Qty: 90 | Fill #1

## 2019-11-17 MED FILL — LISINOPRIL 20 MG TABLET: 20 | 90 days supply | Qty: 90 | Fill #1

## 2019-11-17 MED FILL — PRAVASTATIN SODIUM 20 MG TA: 20 | 90 days supply | Qty: 90 | Fill #1

## 2020-02-14 DIAGNOSIS — R5381 Other malaise: Secondary | ICD-10-CM | POA: Diagnosis not present

## 2020-02-14 DIAGNOSIS — R7301 Impaired fasting glucose: Secondary | ICD-10-CM | POA: Diagnosis not present

## 2020-02-14 DIAGNOSIS — K2100 Gastro-esophageal reflux disease with esophagitis, without bleeding: Secondary | ICD-10-CM | POA: Diagnosis not present

## 2020-02-14 DIAGNOSIS — I10 Essential (primary) hypertension: Secondary | ICD-10-CM | POA: Diagnosis not present

## 2020-02-14 DIAGNOSIS — E782 Mixed hyperlipidemia: Secondary | ICD-10-CM | POA: Diagnosis not present

## 2020-02-21 ENCOUNTER — Other Ambulatory Visit (HOSPITAL_COMMUNITY): Payer: Self-pay | Admitting: Family Medicine

## 2020-02-21 DIAGNOSIS — Z0001 Encounter for general adult medical examination with abnormal findings: Secondary | ICD-10-CM | POA: Diagnosis not present

## 2020-02-21 DIAGNOSIS — G4733 Obstructive sleep apnea (adult) (pediatric): Secondary | ICD-10-CM | POA: Diagnosis not present

## 2020-02-28 MED FILL — LOSARTAN POTASSIUM 50 MG TA: 50 | 30 days supply | Qty: 30 | Fill #0

## 2020-03-23 MED FILL — LOSARTAN POTASSIUM 50 MG TA: 50 | 30 days supply | Qty: 30 | Fill #1

## 2020-04-22 MED FILL — LOSARTAN POTASSIUM 50 MG TA: 50 | 30 days supply | Qty: 30 | Fill #2

## 2020-05-17 MED FILL — PRAVASTATIN SODIUM 20 MG TA: 20 | 90 days supply | Qty: 90 | Fill #3

## 2020-05-17 MED FILL — METOPROLOL SUCCINATE ER 100: 100 | 90 days supply | Qty: 90 | Fill #3

## 2020-05-17 MED FILL — LISINOPRIL 20 MG TABLET: 20 | 90 days supply | Qty: 90 | Fill #3

## 2020-05-19 MED FILL — LOSARTAN POTASSIUM 50 MG TA: 50 | 30 days supply | Qty: 30 | Fill #3

## 2020-06-18 MED FILL — LOSARTAN POTASSIUM 50 MG TA: 50 | 30 days supply | Qty: 30 | Fill #4

## 2020-07-18 MED FILL — LOSARTAN POTASSIUM 50 MG TA: 50 | 30 days supply | Qty: 30 | Fill #5

## 2020-08-15 ENCOUNTER — Other Ambulatory Visit (HOSPITAL_COMMUNITY): Payer: Self-pay | Admitting: Family Medicine

## 2020-08-15 MED FILL — PRAVASTATIN SODIUM 20 MG TA: 20 | 90 days supply | Qty: 90 | Fill #0

## 2020-08-15 MED FILL — METOPROLOL SUCCINATE ER 100: 100 | 90 days supply | Qty: 90 | Fill #0

## 2020-08-17 MED FILL — LOSARTAN POTASSIUM 50 MG TA: 50 | 30 days supply | Qty: 30 | Fill #6

## 2020-09-18 MED FILL — LOSARTAN POTASSIUM 100 MG T: 100 | 30 days supply | Qty: 15 | Fill #0

## 2020-09-20 DIAGNOSIS — Z961 Presence of intraocular lens: Secondary | ICD-10-CM | POA: Diagnosis not present

## 2020-09-20 DIAGNOSIS — H0102A Squamous blepharitis right eye, upper and lower eyelids: Secondary | ICD-10-CM | POA: Diagnosis not present

## 2020-09-20 DIAGNOSIS — H31002 Unspecified chorioretinal scars, left eye: Secondary | ICD-10-CM | POA: Diagnosis not present

## 2020-10-23 MED FILL — LOSARTAN POTASSIUM 50 MG TA: 50 | 30 days supply | Qty: 30 | Fill #0

## 2020-11-16 MED FILL — LOSARTAN POTASSIUM 50 MG TA: 50 | 30 days supply | Qty: 30 | Fill #1

## 2020-11-28 ENCOUNTER — Other Ambulatory Visit (HOSPITAL_BASED_OUTPATIENT_CLINIC_OR_DEPARTMENT_OTHER): Payer: Self-pay

## 2020-12-05 DIAGNOSIS — K4090 Unilateral inguinal hernia, without obstruction or gangrene, not specified as recurrent: Secondary | ICD-10-CM | POA: Diagnosis not present

## 2020-12-19 ENCOUNTER — Other Ambulatory Visit (HOSPITAL_COMMUNITY): Payer: Self-pay

## 2020-12-19 MED FILL — Losartan Potassium Tab 50 MG: ORAL | 60 days supply | Qty: 60 | Fill #0 | Status: AC

## 2020-12-19 MED FILL — Metoprolol Succinate Tab ER 24HR 100 MG (Tartrate Equiv): ORAL | 90 days supply | Qty: 90 | Fill #0 | Status: AC

## 2020-12-19 MED FILL — Pravastatin Sodium Tab 20 MG: ORAL | 90 days supply | Qty: 90 | Fill #0 | Status: AC

## 2021-01-04 ENCOUNTER — Other Ambulatory Visit: Payer: Self-pay

## 2021-01-04 ENCOUNTER — Ambulatory Visit: Payer: Medicare Other | Admitting: General Surgery

## 2021-01-04 ENCOUNTER — Encounter: Payer: Self-pay | Admitting: General Surgery

## 2021-01-04 VITALS — BP 172/88 | HR 58 | Temp 98.9°F | Resp 14 | Ht 69.0 in | Wt 210.0 lb

## 2021-01-04 DIAGNOSIS — K4090 Unilateral inguinal hernia, without obstruction or gangrene, not specified as recurrent: Secondary | ICD-10-CM

## 2021-01-04 NOTE — Patient Instructions (Signed)
Inguinal Hernia, Adult An inguinal hernia develops when fat or the intestines push through a weak spot in a muscle where the leg meets the lower abdomen (groin). This creates a bulge. This kind of hernia could also be:  In the scrotum, if you are male.  In folds of skin around the vagina, if you are male. There are three types of inguinal hernias:  Hernias that can be pushed back into the abdomen (are reducible). This type rarely causes pain.  Hernias that are not reducible (are incarcerated).  Hernias that are not reducible and lose their blood supply (are strangulated). This type of hernia requires emergency surgery. What are the causes? This condition is caused by having a weak spot in the muscles or tissues in your groin. This develops over time. The hernia may poke through the weak spot when you suddenly strain your lower abdominal muscles, such as when you:  Lift a heavy object.  Strain to have a bowel movement. Constipation can lead to straining.  Cough. What increases the risk? This condition is more likely to develop in:  Males.  Pregnant females.  People who: ? Are overweight. ? Work in jobs that require long periods of standing or heavy lifting. ? Have had an inguinal hernia before. ? Smoke or have lung disease. These factors can lead to long-term (chronic) coughing. What are the signs or symptoms? Symptoms may depend on the size of the hernia. Often, a small inguinal hernia has no symptoms. Symptoms of a larger hernia may include:  A bulge in the groin area. This is easier to see when standing. It might not be visible when lying down.  Pain or burning in the groin. This may get worse when lifting, straining, or coughing.  A dull ache or a feeling of pressure in the groin.  An unusual bulge in the scrotum, in males. Symptoms of a strangulated inguinal hernia may include:  A bulge in your groin that is very painful and tender to the touch.  A bulge that  turns red or purple.  Fever, nausea, and vomiting.  Inability to have a bowel movement or to pass gas. How is this diagnosed? This condition is diagnosed based on your symptoms, your medical history, and a physical exam. Your health care provider may feel your groin area and ask you to cough. How is this treated? Treatment depends on the size of your hernia and whether you have symptoms. If you do not have symptoms, your health care provider may have you watch your hernia carefully and have you come in for follow-up visits. If your hernia is large or if you have symptoms, you may need surgery to repair the hernia. Follow these instructions at home: Lifestyle  Avoid lifting heavy objects.  Avoid standing for long periods of time.  Do not use any products that contain nicotine or tobacco. These products include cigarettes, chewing tobacco, and vaping devices, such as e-cigarettes. If you need help quitting, ask your health care provider.  Maintain a healthy weight. Preventing constipation You may need to take these actions to prevent or treat constipation:  Drink enough fluid to keep your urine pale yellow.  Take over-the-counter or prescription medicines.  Eat foods that are high in fiber, such as beans, whole grains, and fresh fruits and vegetables.  Limit foods that are high in fat and processed sugars, such as fried or sweet foods. General instructions  You may try to push the hernia back in place by very gently   pressing on it while lying down. Do not try to force the bulge back in if it will not push in easily.  Watch your hernia for any changes in shape, size, or color. Get help right away if you notice any changes.  Take over-the-counter and prescription medicines only as told by your health care provider.  Keep all follow-up visits. This is important. Contact a health care provider if:  You have a fever or chills.  You develop new symptoms.  Your symptoms get  worse. Get help right away if:  You have pain in your groin that suddenly gets worse.  You have a bulge in your groin that: ? Suddenly gets bigger and does not get smaller. ? Becomes red or purple or painful to the touch.  You are a man and you have a sudden pain in your scrotum, or the size of your scrotum suddenly changes.  You cannot push the hernia back in place by very gently pressing on it when you are lying down.  You have nausea or vomiting that does not go away.  You have a fast heartbeat.  You cannot have a bowel movement or pass gas. These symptoms may represent a serious problem that is an emergency. Do not wait to see if the symptoms will go away. Get medical help right away. Call your local emergency services (911 in the [place] Summary  An inguinal hernia develops when fat or the intestines push through a weak spot in a muscle where your leg meets your lower abdomen (groin).  This condition is caused by having a weak spot in muscles or tissues in your groin.  Symptoms may depend on the size of the hernia, and they may include pain or swelling in your groin. A small inguinal hernia often has no symptoms.  Treatment may not be needed if you do not have symptoms. If you have symptoms or a large hernia, you may need surgery to repair the hernia.  Avoid lifting heavy objects. Also, avoid standing for long periods of time. This information is not intended to replace advice given to you by your health care provider. Make sure you discuss any questions you have with your health care provider. Document Revised: 04/25/2020 Document Reviewed: 04/25/2020 Elsevier Patient Education  2021 [employer] Repair, Adult Open hernia repair is a surgical procedure to fix a hernia. A hernia occurs when an internal organ or tissue pushes through a weak spot in the muscles along the wall of the abdomen. Hernias commonly occur in the groin and around the belly button. Most  hernias tend to get worse over time. Often, surgery is done to prevent the hernia from becoming bigger, uncomfortable, or an emergency. Emergency surgery may be needed if contents of the abdomen get stuck in the opening (incarcerated hernia) or if the blood supply gets cut off (strangulated hernia). In an open repair, an incision is made in the abdomen to perform the surgery. Tell a health care provider about:  Any allergies you have.  All medicines you are taking, including vitamins, herbs, eye drops, creams, and over-the-counter medicines.  Any problems you or family members have had with anesthetic medicines.  Any blood or bone disorders you have.  Any surgeries you have had.  Any medical conditions you have, including any recent cold or flu (influenza)symptoms.  Whether you are pregnant or may be pregnant. What are the risks? Generally, this is a safe procedure. However, problems may occur, including:  Long-lasting (chronic) pain.  Bleeding.  Infection.  Damage to the testicles. This can cause shrinking or swelling.  Damage to nearby structures or organs, including the bladder, blood vessels, intestines, or nerves near the hernia.  Blood clots.  Trouble passing urine.  Return of the hernia. Medicines Ask your health care provider about:  Changing or stopping your regular medicines. This is especially important if you are taking diabetes medicines or blood thinners.  Taking medicines such as aspirin and ibuprofen. These medicines can thin your blood. Do not take these medicines unless your health care provider tells you to take them.  Taking over-the-counter medicines, vitamins, herbs, and supplements. Surgery safety Ask your health care provider:  How your surgery site will be marked.  What steps will be taken to help prevent infection. These steps may include: ? Removing hair at the surgery site. ? Washing skin with a germ-killing soap. ? Receiving antibiotic  medicine. General instructions  You may have an exam or testing, such as blood tests or imaging studies.  Do not use any products that contain nicotine or tobacco for at least 4 weeks before the procedure. These products include cigarettes, chewing tobacco, and vaping devices, such as e-cigarettes. If you need help quitting, ask your health care provider.  Let your health care provider know if you develop a cold or any infection before your surgery. If you get an infection before surgery, you may receive antibiotics to treat it.  Plan to have a responsible adult take you home from the hospital or clinic.  If you will be going home right after the procedure, plan to have a responsible adult care for you for the time you are told. This is important. What happens during the procedure?  An IV will be inserted into one of your veins.  You will be given one or more of the following: ? A medicine to help you relax (sedative). ? A medicine to numb the area (local anesthetic). ? A medicine to make you fall asleep (general anesthetic).  Your surgeon will make an incision over the hernia.  The tissues of the hernia will be moved back into place.  The edges of the hernia may be stitched (sutured) together.  The opening in the abdominal muscles will be closed with stitches (sutures). Or, your surgeon will place a mesh patch made of artificial (synthetic) material over the opening.  The incision will be closed with sutures, skin glue, or adhesive strips.  A bandage (dressing) may be placed over the incision. The procedure may vary among health care providers and hospitals.   What happens after the procedure?  Your blood pressure, heart rate, breathing rate, and blood oxygen level will be monitored until you leave the hospital or clinic.  You may be given medicine for pain.  If you were given a sedative during the procedure, it can affect you for several hours. Do not drive or operate  machinery until your health care provider says that it is safe. Summary  Open hernia repair is a surgical procedure to fix a hernia. Hernias commonly occur in the groin and around the belly button.  Emergency surgery may be needed if contents of the abdomen get stuck in the opening (incarcerated hernia) or if the blood supply gets cut off (strangulated hernia).  In this procedure, an incision is made in the abdomen to perform the surgery.  After the procedure, you may be given medicine for pain. This information is not intended to replace advice  given to you by your health care provider. Make sure you discuss any questions you have with your health care provider. Document Revised: 04/10/2020 Document Reviewed: 04/10/2020 Elsevier Patient Education  2021 [employer].

## 2021-01-04 NOTE — Progress Notes (Signed)
[facility] History and Physical  Reason for Referral: Right inguinal hernia  Referring Physician:  Manon Hilding, MD   Chief Complaint    New Patient (Initial Visit)      Gregory Medina is a 65 y.o. male.  HPI:  Gregory Medina is a very sweet 65 yo who is here today with his wife Bambi who just retired from [employer].  He comes in and says he has noticed for a few weeks now and started first with pain and then progressed to feeling a bulge.  He says that the bulge is out mostly after he has been walking or being active or at night. He says that it is pretty firm during the day but goes back in when he lays down at night. He had a prior left inguinal hernia repair in 1996.   Past Medical History:  Diagnosis Date  . Hypertension   . Renal disorder    kidney stone    Past Surgical History:  Procedure Laterality Date  . BACK SURGERY    . HERNIA REPAIR      Family History  Problem Relation Age of Onset  . Hypertension Mother   . Heart disease Mother   . Multiple myeloma Father     Social History   Tobacco Use  . Smoking status: Former Smoker    Types: Cigarettes  . Smokeless tobacco: Never Used  . Tobacco comment: quite 40+ years ago  Substance Use Topics  . Alcohol use: Yes    Comment: occasionally  . Drug use: No    Medications: I have reviewed the patient's current medications. Allergies as of 01/04/2021   No Known Allergies     Medication List       Accurate as of January 04, 2021 11:59 PM. If you have any questions, ask your nurse or doctor.        STOP taking these medications   aspirin 81 MG tablet Stopped by: Gregory Cagey, MD   cyclobenzaprine 10 MG tablet Commonly known as: FLEXERIL Stopped by: Gregory Cagey, MD   lisinopril 20 MG tablet Commonly known as: ZESTRIL Stopped by: Gregory Cagey, MD   oxyCODONE-acetaminophen 5-325 MG tablet Commonly known as: Percocet Stopped by: Gregory Cagey, MD   tamsulosin 0.4 MG  Caps capsule Commonly known as: FLOMAX Stopped by: Gregory Cagey, MD     TAKE these medications   fish oil-omega-3 fatty acids 1000 MG capsule Take 1 g by mouth daily.   losartan 50 MG tablet Commonly known as: COZAAR TAKE 1 TABLET BY MOUTH EVERY DAY   metoprolol succinate 100 MG 24 hr tablet Commonly known as: TOPROL-XL TAKE 1 TABLET BY MOUTH ONCE DAILY What changed: Another medication with the same name was removed. Continue taking this medication, and follow the directions you see here. Changed by: Gregory Cagey, MD   multivitamin tablet Take 1 tablet by mouth daily.   pravastatin 20 MG tablet Commonly known as: PRAVACHOL TAKE 1 TABLET BY MOUTH ONCE DAILY AT BEDTIME What changed: Another medication with the same name was removed. Continue taking this medication, and follow the directions you see here. Changed by: Gregory Cagey, MD        ROS:  A comprehensive review of systems was negative except for: Gastrointestinal: positive for abdominal pain Musculoskeletal: positive for back pain and joint pain Endocrine: positive for tired/ sluggish  Blood pressure (!) 172/88, pulse (!) 58, temperature 98.9 F (37.2 C), temperature  source Other (Comment), resp. rate 14, height 5' 9"  (1.753 m), weight 210 lb (95.3 kg), SpO2 96 %. Physical Exam Vitals reviewed.  Constitutional:      Appearance: Normal appearance.  HENT:     Head: Normocephalic.     Nose: Nose normal.  Eyes:     Extraocular Movements: Extraocular movements intact.  Cardiovascular:     Rate and Rhythm: Normal rate and regular rhythm.  Pulmonary:     Effort: Pulmonary effort is normal.     Breath sounds: Normal breath sounds.  Abdominal:     General: There is no distension.     Palpations: Abdomen is soft.     Tenderness: There is no abdominal tenderness.     Hernia: A hernia is present. Hernia is present in the right inguinal area. There is no hernia in the left inguinal area.     Comments:  Reducible right inguinal hernia, scar from left inguinal hernia repair   Musculoskeletal:        General: Normal range of motion.     Cervical back: Normal range of motion.     Comments: LUE larger than RUE stable and congential, birth mark on the LUE and left upper chest  Skin:    General: Skin is warm.  Neurological:     General: No focal deficit present.     Mental Status: He is alert and oriented to person, place, and time.  Psychiatric:        Mood and Affect: Mood normal.        Behavior: Behavior normal.        Thought Content: Thought content normal.        Judgment: Judgment normal.     Results: CT 2014 reviewed- fat containing right inguinal hernia   Assessment & Plan:  OSWALD POTT is a 65 y.o. male with a right inguinal hernia that is getting larger and symptomatic.   -Discussed the risk and benefits including, bleeding, infection, use of mesh, risk of recurrence, risk of nerve damage causing numbness or changes in sensation, risk of damage to the cord structures. The patient understands the risk and benefits of repair with mesh, and has decided to proceed.  We also discussed open versus laparoscopic surgery and the use of mesh. We discussed that I do open repairs with mesh, and that this is considered equivalent to laparoscopic surgery. We discussed reasons for opting for laparoscopic surgery including if a bilateral repair is needed or if a patient has a recurrence after an open repair.  Discussed preop COVID testing.   All questions were answered to the satisfaction of the patient and family.   Gregory Medina 01/07/2021, 8:50 AM

## 2021-01-07 ENCOUNTER — Encounter: Payer: Self-pay | Admitting: General Surgery

## 2021-01-07 NOTE — H&P (Signed)
[facility] History and Physical  Reason for Referral: Right inguinal hernia  Referring Physician:  Manon Hilding, MD   Chief Complaint    New Patient (Initial Visit)      Gregory Medina is a 65 y.o. male.  HPI:  Gregory Medina is a very sweet 65 yo who is here today with his wife Gregory Medina who just retired from [employer].  He comes in and says he has noticed for a few weeks now and started first with pain and then progressed to feeling a bulge.  He says that the bulge is out mostly after he has been walking or being active or at night. He says that it is pretty firm during the day but goes back in when he lays down at night. He had a prior left inguinal hernia repair in 1996.   Past Medical History:  Diagnosis Date  . Hypertension   . Renal disorder    kidney stone    Past Surgical History:  Procedure Laterality Date  . BACK SURGERY    . HERNIA REPAIR      Family History  Problem Relation Age of Onset  . Hypertension Mother   . Heart disease Mother   . Multiple myeloma Father     Social History   Tobacco Use  . Smoking status: Former Smoker    Types: Cigarettes  . Smokeless tobacco: Never Used  . Tobacco comment: quite 40+ years ago  Substance Use Topics  . Alcohol use: Yes    Comment: occasionally  . Drug use: No    Medications: I have reviewed the patient's current medications. Allergies as of 01/04/2021   No Known Allergies     Medication List       Accurate as of January 04, 2021 11:59 PM. If you have any questions, ask your nurse or doctor.        STOP taking these medications   aspirin 81 MG tablet Stopped by: Gregory Cagey, MD   cyclobenzaprine 10 MG tablet Commonly known as: FLEXERIL Stopped by: Gregory Cagey, MD   lisinopril 20 MG tablet Commonly known as: ZESTRIL Stopped by: Gregory Cagey, MD   oxyCODONE-acetaminophen 5-325 MG tablet Commonly known as: Percocet Stopped by: Gregory Cagey, MD   tamsulosin 0.4 MG  Caps capsule Commonly known as: FLOMAX Stopped by: Gregory Cagey, MD     TAKE these medications   fish oil-omega-3 fatty acids 1000 MG capsule Take 1 g by mouth daily.   losartan 50 MG tablet Commonly known as: COZAAR TAKE 1 TABLET BY MOUTH EVERY DAY   metoprolol succinate 100 MG 24 hr tablet Commonly known as: TOPROL-XL TAKE 1 TABLET BY MOUTH ONCE DAILY What changed: Another medication with the same name was removed. Continue taking this medication, and follow the directions you see here. Changed by: Gregory Cagey, MD   multivitamin tablet Take 1 tablet by mouth daily.   pravastatin 20 MG tablet Commonly known as: PRAVACHOL TAKE 1 TABLET BY MOUTH ONCE DAILY AT BEDTIME What changed: Another medication with the same name was removed. Continue taking this medication, and follow the directions you see here. Changed by: Gregory Cagey, MD        ROS:  A comprehensive review of systems was negative except for: Gastrointestinal: positive for abdominal pain Musculoskeletal: positive for back pain and joint pain Endocrine: positive for tired/ sluggish  Blood pressure (!) 172/88, pulse (!) 58, temperature 98.9 F (37.2 C), temperature  source Other (Comment), resp. rate 14, height 5' 9"  (1.753 m), weight 210 lb (95.3 kg), SpO2 96 %. Physical Exam Vitals reviewed.  Constitutional:      Appearance: Normal appearance.  HENT:     Head: Normocephalic.     Nose: Nose normal.  Eyes:     Extraocular Movements: Extraocular movements intact.  Cardiovascular:     Rate and Rhythm: Normal rate and regular rhythm.  Pulmonary:     Effort: Pulmonary effort is normal.     Breath sounds: Normal breath sounds.  Abdominal:     General: There is no distension.     Palpations: Abdomen is soft.     Tenderness: There is no abdominal tenderness.     Hernia: A hernia is present. Hernia is present in the right inguinal area. There is no hernia in the left inguinal area.     Comments:  Reducible right inguinal hernia, scar from left inguinal hernia repair   Musculoskeletal:        General: Normal range of motion.     Cervical back: Normal range of motion.     Comments: LUE larger than RUE stable and congential, birth mark on the LUE and left upper chest  Skin:    General: Skin is warm.  Neurological:     General: No focal deficit present.     Mental Status: He is alert and oriented to person, place, and time.  Psychiatric:        Mood and Affect: Mood normal.        Behavior: Behavior normal.        Thought Content: Thought content normal.        Judgment: Judgment normal.     Results: CT 2014 reviewed- fat containing right inguinal hernia   Assessment & Plan:  Gregory Medina is a 65 y.o. male with a right inguinal hernia that is getting larger and symptomatic.   -Discussed the risk and benefits including, bleeding, infection, use of mesh, risk of recurrence, risk of nerve damage causing numbness or changes in sensation, risk of damage to the cord structures. The patient understands the risk and benefits of repair with mesh, and has decided to proceed.  We also discussed open versus laparoscopic surgery and the use of mesh. We discussed that I do open repairs with mesh, and that this is considered equivalent to laparoscopic surgery. We discussed reasons for opting for laparoscopic surgery including if a bilateral repair is needed or if a patient has a recurrence after an open repair.  Discussed preop COVID testing.   All questions were answered to the satisfaction of the patient and family.   Gregory Medina 01/07/2021, 8:50 AM

## 2021-01-22 NOTE — Patient Instructions (Addendum)
Gregory Medina  01/22/2021     @PREFPERIOPPHARMACY @   Your procedure is scheduled on  01/24/2021   Report to [facility] at  [place].M.   Call this number if you have problems the morning of surgery:  [phone]   Remember:  Do not eat or drink after midnight.                       Take these medicines the morning of surgery with A SIP OF WATER  None   Place clean sheets on your bed the night before your procedure and DO NOT sleep with pets this night.  Shower with CHG the night before and the morning of your procedure. DO NOT use CHG on your face, hair or genitals.  After each shower, dry off with a clean towel put on clean, comfortable clothes and brush your teeth.      Do not wear jewelry, make-up or nail polish.  Do not wear lotions, powders, or perfumes, or deodorant.  Do not shave 48 hours prior to surgery.  Men may shave face and neck.  Do not bring valuables to the hospital.  [facility] is not responsible for any belongings or valuables.  Contacts, dentures or bridgework may not be worn into surgery.  Leave your suitcase in the car.  After surgery it may be brought to your room.  For patients admitted to the hospital, discharge time will be determined by your treatment team.  Patients discharged the day of surgery will not be allowed to drive home and must have someone with them for 24 hours.   Special instructions:  DO NOT smoke tobacco or vape for 24 hours before your procedure.  Please read over the following fact sheets that you were given. Coughing and Deep Breathing, Surgical Site Infection Prevention, Anesthesia Post-op Instructions and Care and Recovery After Surgery       Open Hernia Repair, Adult, Care After What can I expect after the procedure? After the procedure, it is common to have:  Mild discomfort.  Slight bruising.  Mild swelling.  Pain in the belly (abdomen).  A small amount of blood from the cut from surgery  (incision). Follow these instructions at home: Your doctor may give you more specific instructions. If you have problems, call your doctor. Medicines  Take over-the-counter and prescription medicines only as told by your doctor.  If told, take steps to prevent problems with pooping (constipation). You may need to: ? Drink enough fluid to keep your pee (urine) pale yellow. ? Take medicines. You will be told what medicines to take. ? Eat foods that are high in fiber. These include beans, whole grains, and fresh fruits and vegetables. ? Limit foods that are high in fat and sugar. These include fried or sweet foods.  Ask your doctor if you should avoid driving or using machines while you are taking your medicine. Incision care  Follow instructions from your doctor about how to take care of your incision. Make sure you: ? Wash your hands with soap and water for at least 20 seconds before and after you change your bandage (dressing). If you cannot use soap and water, use hand sanitizer. ? Change your bandage. ? Leave stitches or skin glue in place for at least 2 weeks. ? Leave tape strips alone unless you are told to take them off. You may trim the edges of the tape strips if  they curl up.  Check your incision every day for signs of infection. Check for: ? More redness, swelling, or pain. ? More fluid or blood. ? Warmth. ? Pus or a bad smell.  Wear loose, soft clothing while your incision heals.   Activity  Rest as told by your doctor.  Do not lift anything that is heavier than 10 lb (4.5 kg), or the limit that you are told.  Do not play contact sports until your doctor says that this is safe.  If you were given a sedative during your procedure, do not drive or use machines until your doctor says that it is safe. A sedative is a medicine that helps you relax.  Return to your normal activities when your doctor says that it is safe.   General instructions  Do not take baths, swim,  or use a hot tub. Ask your doctor about taking showers or sponge baths.  Hold a pillow over your belly when you cough or sneeze. This helps with pain.  Do not smoke or use any products that contain nicotine or tobacco. If you need help quitting, ask your doctor.  Keep all follow-up visits. Contact a doctor if:  You have any of these signs of infection in or around your incision: ? More redness, swelling, or pain. ? More fluid or blood. ? Warmth. ? Pus. ? A bad smell.  You have a fever or chills.  You have blood in your poop (stool).  You have not pooped (had a bowel movement) in 2-3 days.  Medicine does not help your pain. Get help right away if:  You have chest pain, or you are short of breath.  You feel faint or light-headed.  You have very bad pain.  You vomit and your pain is worse.  You have pain, swelling, or redness in a leg. These symptoms may be an emergency. Get help right away. Call your local emergency services (911 in the [place]  Do not wait to see if the symptoms will go away.  Do not drive yourself to the hospital. Summary  After this procedure, it is common to have mild discomfort, slight bruising, and mild swelling.  Follow instructions from your doctor about how to take care of your cut from surgery (incision). Check every day for signs of infection.  Do not lift heavy objects or play contact sports until your doctor says it is safe.  Return to your normal activities as told by your doctor. This information is not intended to replace advice given to you by your health care provider. Make sure you discuss any questions you have with your health care provider. Document Revised: 04/10/2020 Document Reviewed: 04/10/2020 Elsevier Patient Education  2021 [employer] Anesthesia, Adult, Care After This sheet gives you information about how to care for yourself after your procedure. Your health care provider may also give you more specific  instructions. If you have problems or questions, contact your health care provider. What can I expect after the procedure? After the procedure, the following side effects are common:  Pain or discomfort at the IV site.  Nausea.  Vomiting.  Sore throat.  Trouble concentrating.  Feeling cold or chills.  Feeling weak or tired.  Sleepiness and fatigue.  Soreness and body aches. These side effects can affect parts of the body that were not involved in surgery. Follow these instructions at home: For the time period you were told by your health care provider:  Rest.  Do  not participate in activities where you could fall or become injured.  Do not drive or use machinery.  Do not drink alcohol.  Do not take sleeping pills or medicines that cause drowsiness.  Do not make important decisions or sign legal documents.  Do not take care of children on your own.   Eating and drinking  Follow any instructions from your health care provider about eating or drinking restrictions.  When you feel hungry, start by eating small amounts of foods that are soft and easy to digest (bland), such as toast. Gradually return to your regular diet.  Drink enough fluid to keep your urine pale yellow.  If you vomit, rehydrate by drinking water, juice, or clear broth. General instructions  If you have sleep apnea, surgery and certain medicines can increase your risk for breathing problems. Follow instructions from your health care provider about wearing your sleep device: ? Anytime you are sleeping, including during daytime naps. ? While taking prescription pain medicines, sleeping medicines, or medicines that make you drowsy.  Have a responsible adult stay with you for the time you are told. It is important to have someone help care for you until you are awake and alert.  Return to your normal activities as told by your health care provider. Ask your health care provider what activities are safe  for you.  Take over-the-counter and prescription medicines only as told by your health care provider.  If you smoke, do not smoke without supervision.  Keep all follow-up visits as told by your health care provider. This is important. Contact a health care provider if:  You have nausea or vomiting that does not get better with medicine.  You cannot eat or drink without vomiting.  You have pain that does not get better with medicine.  You are unable to pass urine.  You develop a skin rash.  You have a fever.  You have redness around your IV site that gets worse. Get help right away if:  You have difficulty breathing.  You have chest pain.  You have blood in your urine or stool, or you vomit blood. Summary  After the procedure, it is common to have a sore throat or nausea. It is also common to feel tired.  Have a responsible adult stay with you for the time you are told. It is important to have someone help care for you until you are awake and alert.  When you feel hungry, start by eating small amounts of foods that are soft and easy to digest (bland), such as toast. Gradually return to your regular diet.  Drink enough fluid to keep your urine pale yellow.  Return to your normal activities as told by your health care provider. Ask your health care provider what activities are safe for you. This information is not intended to replace advice given to you by your health care provider. Make sure you discuss any questions you have with your health care provider. Document Revised: 05/11/2020 Document Reviewed: 12/09/2019 Elsevier Patient Education  2021 [employer].

## 2021-01-23 ENCOUNTER — Other Ambulatory Visit (HOSPITAL_COMMUNITY)
Admission: RE | Admit: 2021-01-23 | Discharge: 2021-01-23 | Disposition: A | Discharge status: Home / Self Care | Payer: Medicare Other | Source: Ambulatory Visit | Attending: General Surgery | Admitting: General Surgery

## 2021-01-23 ENCOUNTER — Other Ambulatory Visit: Payer: Self-pay

## 2021-01-23 ENCOUNTER — Encounter (HOSPITAL_COMMUNITY): Payer: Self-pay

## 2021-01-23 ENCOUNTER — Encounter (HOSPITAL_COMMUNITY)
Admission: RE | Admit: 2021-01-23 | Discharge: 2021-01-23 | Disposition: A | Discharge status: Home / Self Care | Payer: Medicare Other | Source: Ambulatory Visit | Attending: General Surgery | Admitting: General Surgery

## 2021-01-23 DIAGNOSIS — Z20822 Contact with and (suspected) exposure to covid-19: Secondary | ICD-10-CM | POA: Insufficient documentation

## 2021-01-23 DIAGNOSIS — Z01818 Encounter for other preprocedural examination: Secondary | ICD-10-CM | POA: Diagnosis not present

## 2021-01-23 HISTORY — DX: Personal history of urinary calculi: Z87.442

## 2021-01-23 HISTORY — DX: Sleep apnea, unspecified: G47.30

## 2021-01-23 HISTORY — DX: Pure hypercholesterolemia, unspecified: E78.00

## 2021-01-23 LAB — CBC WITH DIFFERENTIAL/PLATELET
Abs Immature Granulocytes: 0.03 10*3/uL (ref 0.00–0.07)
Basophils Absolute: 0.1 10*3/uL (ref 0.0–0.1)
Basophils Relative: 1 %
Eosinophils Absolute: 0.2 10*3/uL (ref 0.0–0.5)
Eosinophils Relative: 4 %
HCT: 46.8 % (ref 39.0–52.0)
Hemoglobin: 15.3 g/dL (ref 13.0–17.0)
Immature Granulocytes: 1 %
Lymphocytes Relative: 36 %
Lymphs Abs: 1.8 10*3/uL (ref 0.7–4.0)
MCH: 32.3 pg (ref 26.0–34.0)
MCHC: 32.7 g/dL (ref 30.0–36.0)
MCV: 98.9 fL (ref 80.0–100.0)
Monocytes Absolute: 0.8 10*3/uL (ref 0.1–1.0)
Monocytes Relative: 15 %
Neutro Abs: 2.2 10*3/uL (ref 1.7–7.7)
Neutrophils Relative %: 43 %
Platelets: 227 10*3/uL (ref 150–400)
RBC: 4.73 MIL/uL (ref 4.22–5.81)
RDW: 12.5 % (ref 11.5–15.5)
WBC: 5 10*3/uL (ref 4.0–10.5)
nRBC: 0 % (ref 0.0–0.2)

## 2021-01-23 LAB — SARS CORONAVIRUS 2 (TAT 6-24 HRS): SARS Coronavirus 2: NEGATIVE

## 2021-01-24 ENCOUNTER — Encounter (HOSPITAL_COMMUNITY): Payer: Self-pay | Admitting: General Surgery

## 2021-01-24 ENCOUNTER — Ambulatory Visit (HOSPITAL_COMMUNITY): Payer: Medicare Other | Admitting: Certified Registered Nurse Anesthetist

## 2021-01-24 ENCOUNTER — Ambulatory Visit (HOSPITAL_COMMUNITY)
Admission: RE | Admit: 2021-01-24 | Discharge: 2021-01-24 | Disposition: A | Discharge status: Home / Self Care | Payer: Medicare Other | Attending: General Surgery | Admitting: General Surgery

## 2021-01-24 ENCOUNTER — Other Ambulatory Visit: Payer: Self-pay

## 2021-01-24 ENCOUNTER — Encounter (HOSPITAL_COMMUNITY)
Admission: RE | Disposition: A | Discharge status: Home / Self Care | Payer: Self-pay | Source: Home / Self Care | Attending: General Surgery

## 2021-01-24 DIAGNOSIS — Z87891 Personal history of nicotine dependence: Secondary | ICD-10-CM | POA: Insufficient documentation

## 2021-01-24 DIAGNOSIS — D176 Benign lipomatous neoplasm of spermatic cord: Secondary | ICD-10-CM | POA: Insufficient documentation

## 2021-01-24 DIAGNOSIS — Z79899 Other long term (current) drug therapy: Secondary | ICD-10-CM | POA: Diagnosis not present

## 2021-01-24 DIAGNOSIS — K4090 Unilateral inguinal hernia, without obstruction or gangrene, not specified as recurrent: Secondary | ICD-10-CM

## 2021-01-24 HISTORY — PX: INGUINAL HERNIA REPAIR: SHX194

## 2021-01-24 SURGERY — REPAIR, HERNIA, INGUINAL, ADULT
Anesthesia: General | Site: Groin | Laterality: Right

## 2021-01-24 MED ORDER — ONDANSETRON HCL 4 MG/2ML IJ SOLN
4.0000 mg | Freq: Once | INTRAMUSCULAR | Status: DC | PRN
Start: 2021-01-24 — End: 2021-01-24

## 2021-01-24 MED ORDER — FENTANYL CITRATE (PF) 250 MCG/5ML IJ SOLN
INTRAMUSCULAR | Status: AC
  Filled 2021-01-24: qty 5

## 2021-01-24 MED ORDER — SODIUM CHLORIDE 0.9 % IR SOLN
Status: DC | PRN
  Administered 2021-01-24: 1000 mL

## 2021-01-24 MED ORDER — BUPIVACAINE LIPOSOME 1.3 % IJ SUSP
INTRAMUSCULAR | Status: AC
  Filled 2021-01-24: qty 20

## 2021-01-24 MED ORDER — CHLORHEXIDINE GLUCONATE 0.12 % MT SOLN: 15.0000 mL | Freq: Once | OROMUCOSAL | Status: DC

## 2021-01-24 MED ORDER — OXYCODONE HCL 5 MG PO TABS: 5.0000 mg | ORAL_TABLET | ORAL | 0 refills | Status: DC | PRN

## 2021-01-24 MED ORDER — EPHEDRINE 5 MG/ML INJ
INTRAVENOUS | Status: AC
  Filled 2021-01-24: qty 10

## 2021-01-24 MED ORDER — CHLORHEXIDINE GLUCONATE CLOTH 2 % EX PADS
6.0000 | MEDICATED_PAD | Freq: Once | CUTANEOUS | Status: AC
  Administered 2021-01-24: 6 via TOPICAL

## 2021-01-24 MED ORDER — FENTANYL CITRATE (PF) 250 MCG/5ML IJ SOLN
INTRAMUSCULAR | Status: DC | PRN
  Administered 2021-01-24 (×3): 50 ug via INTRAVENOUS

## 2021-01-24 MED ORDER — LACTATED RINGERS IV SOLN: INTRAVENOUS | Status: DC

## 2021-01-24 MED ORDER — ONDANSETRON HCL 4 MG PO TABS: 4.0000 mg | ORAL_TABLET | Freq: Three times a day (TID) | ORAL | 1 refills | Status: AC | PRN

## 2021-01-24 MED ORDER — PROPOFOL 10 MG/ML IV BOLUS
INTRAVENOUS | Status: DC | PRN
  Administered 2021-01-24: 200 mg via INTRAVENOUS

## 2021-01-24 MED ORDER — DEXAMETHASONE SODIUM PHOSPHATE 10 MG/ML IJ SOLN
INTRAMUSCULAR | Status: DC | PRN
  Administered 2021-01-24: 5 mg via INTRAVENOUS

## 2021-01-24 MED ORDER — SEVOFLURANE IN SOLN
RESPIRATORY_TRACT | Status: AC
  Filled 2021-01-24: qty 250

## 2021-01-24 MED ORDER — CHLORHEXIDINE GLUCONATE CLOTH 2 % EX PADS: 6.0000 | MEDICATED_PAD | Freq: Once | CUTANEOUS | Status: DC

## 2021-01-24 MED ORDER — CEFAZOLIN SODIUM-DEXTROSE 2-4 GM/100ML-% IV SOLN
2.0000 g | INTRAVENOUS | Status: AC
  Administered 2021-01-24: 2 g via INTRAVENOUS
  Filled 2021-01-24: qty 100

## 2021-01-24 MED ORDER — PROPOFOL 10 MG/ML IV BOLUS
INTRAVENOUS | Status: AC
  Filled 2021-01-24: qty 20

## 2021-01-24 MED ORDER — MIDAZOLAM HCL 2 MG/2ML IJ SOLN
INTRAMUSCULAR | Status: AC
  Filled 2021-01-24: qty 2

## 2021-01-24 MED ORDER — LACTATED RINGERS IV SOLN: INTRAVENOUS | Status: DC | PRN

## 2021-01-24 MED ORDER — ORAL CARE MOUTH RINSE: 15.0000 mL | Freq: Once | OROMUCOSAL | Status: DC

## 2021-01-24 MED ORDER — MIDAZOLAM HCL 2 MG/2ML IJ SOLN
INTRAMUSCULAR | Status: DC | PRN
  Administered 2021-01-24: 2 mg via INTRAVENOUS

## 2021-01-24 MED ORDER — LIDOCAINE HCL (CARDIAC) PF 100 MG/5ML IV SOSY
PREFILLED_SYRINGE | INTRAVENOUS | Status: DC | PRN
  Administered 2021-01-24: 60 mg via INTRAVENOUS

## 2021-01-24 MED ORDER — BUPIVACAINE LIPOSOME 1.3 % IJ SUSP
INTRAMUSCULAR | Status: DC | PRN
  Administered 2021-01-24: 20 mL

## 2021-01-24 MED ORDER — FENTANYL CITRATE (PF) 100 MCG/2ML IJ SOLN
25.0000 ug | INTRAMUSCULAR | Status: DC | PRN
  Administered 2021-01-24: 25 ug via INTRAVENOUS
  Administered 2021-01-24: 50 ug via INTRAVENOUS
  Administered 2021-01-24: 25 ug via INTRAVENOUS
  Filled 2021-01-24: qty 2

## 2021-01-24 MED ORDER — ONDANSETRON HCL 4 MG/2ML IJ SOLN
INTRAMUSCULAR | Status: DC | PRN
  Administered 2021-01-24: 4 mg via INTRAVENOUS

## 2021-01-24 MED ORDER — EPHEDRINE SULFATE 50 MG/ML IJ SOLN
INTRAMUSCULAR | Status: DC | PRN
  Administered 2021-01-24: 5 mg via INTRAVENOUS
  Administered 2021-01-24 (×2): 10 mg via INTRAVENOUS

## 2021-01-24 SURGICAL SUPPLY — 37 items
ADH SKN CLS APL DERMABOND .7 (GAUZE/BANDAGES/DRESSINGS) ×1
CLOTH BEACON ORANGE TIMEOUT ST (SAFETY) ×2 IMPLANT
COVER LIGHT HANDLE STERIS (MISCELLANEOUS) ×4 IMPLANT
COVER WAND RF STERILE (DRAPES) ×2 IMPLANT
DERMABOND ADVANCED (GAUZE/BANDAGES/DRESSINGS) ×1
DERMABOND ADVANCED .7 DNX12 (GAUZE/BANDAGES/DRESSINGS) ×1 IMPLANT
DRAIN PENROSE 0.5X18 (DRAIN) ×2 IMPLANT
ELECT REM PT RETURN 9FT ADLT (ELECTROSURGICAL) ×2
ELECTRODE REM PT RTRN 9FT ADLT (ELECTROSURGICAL) ×1 IMPLANT
GAUZE SPONGE 4X4 12PLY STRL (GAUZE/BANDAGES/DRESSINGS) ×2 IMPLANT
GLOVE SURG ENC MOIS LTX SZ6.5 (GLOVE) ×2 IMPLANT
GLOVE SURG UNDER POLY LF SZ6.5 (GLOVE) ×2 IMPLANT
GLOVE SURG UNDER POLY LF SZ7 (GLOVE) ×6 IMPLANT
GOWN STRL REUS W/TWL LRG LVL3 (GOWN DISPOSABLE) ×6 IMPLANT
INST SET MINOR GENERAL (KITS) ×2 IMPLANT
KIT TURNOVER KIT A (KITS) ×2 IMPLANT
MANIFOLD NEPTUNE II (INSTRUMENTS) ×2 IMPLANT
MESH MARLEX PLUG MEDIUM (Mesh General) ×1 IMPLANT
NDL HYPO 18GX1.5 BLUNT FILL (NEEDLE) ×1 IMPLANT
NDL HYPO 21X1.5 SAFETY (NEEDLE) ×1 IMPLANT
NEEDLE HYPO 18GX1.5 BLUNT FILL (NEEDLE) ×2 IMPLANT
NEEDLE HYPO 21X1.5 SAFETY (NEEDLE) ×2 IMPLANT
NS IRRIG 1000ML POUR BTL (IV SOLUTION) ×2 IMPLANT
PACK MINOR (CUSTOM PROCEDURE TRAY) ×2 IMPLANT
PAD ARMBOARD 7.5X6 YLW CONV (MISCELLANEOUS) ×2 IMPLANT
PENCIL SMOKE EVACUATOR (MISCELLANEOUS) ×2 IMPLANT
SET BASIN LINEN APH (SET/KITS/TRAYS/PACK) ×2 IMPLANT
SOL PREP PROV IODINE SCRUB 4OZ (MISCELLANEOUS) ×2 IMPLANT
SUT MNCRL AB 4-0 PS2 18 (SUTURE) ×2 IMPLANT
SUT NOVA NAB GS-22 2 2-0 T-19 (SUTURE) ×4 IMPLANT
SUT VIC AB 2-0 CT1 27 (SUTURE) ×2
SUT VIC AB 2-0 CT1 TAPERPNT 27 (SUTURE) ×1 IMPLANT
SUT VIC AB 3-0 SH 27 (SUTURE) ×2
SUT VIC AB 3-0 SH 27X BRD (SUTURE) ×1 IMPLANT
SUT VICRYL AB 3 0 TIES (SUTURE) ×1 IMPLANT
SYR 20ML LL LF (SYRINGE) ×2 IMPLANT
SYR 30ML LL (SYRINGE) ×2 IMPLANT

## 2021-01-24 NOTE — Interval H&P Note (Signed)
History and Physical Interval Note:  01/24/2021 10:08 AM  Gregory Medina  has presented today for surgery, with the diagnosis of Right inguinal hernia.  The various methods of treatment have been discussed with the patient and family. After consideration of risks, benefits and other options for treatment, the patient has consented to  Procedure(s): HERNIA REPAIR INGUINAL ADULT W/MESH (Right) as a surgical intervention.  The patient's history has been reviewed, patient examined, no change in status, stable for surgery.  I have reviewed the patient's chart and labs.  Questions were answered to the patient's satisfaction.    No changes.  Virl Cagey

## 2021-01-24 NOTE — Anesthesia Procedure Notes (Signed)
Procedure Name: LMA Insertion Date/Time: 01/24/2021 10:16 AM Performed by: Karna Dupes, CRNA Pre-anesthesia Checklist: Patient identified, Emergency Drugs available, Suction available and Patient being monitored Patient Re-evaluated:Patient Re-evaluated prior to induction Oxygen Delivery Method: Circle system utilized Preoxygenation: Pre-oxygenation with 100% oxygen Induction Type: IV induction LMA: LMA inserted LMA Size: 5.0 Tube secured with: Tape Dental Injury: Teeth and Oropharynx as per pre-operative assessment

## 2021-01-24 NOTE — Anesthesia Postprocedure Evaluation (Signed)
Anesthesia Post Note  Patient: Gregory Medina  Procedure(s) Performed: HERNIA REPAIR INGUINAL ADULT W/MESH (Right Groin)  Patient location during evaluation: Phase II Anesthesia Type: General Level of consciousness: awake Pain management: pain level controlled Vital Signs Assessment: post-procedure vital signs reviewed and stable Respiratory status: spontaneous breathing and respiratory function stable Cardiovascular status: blood pressure returned to baseline and stable Postop Assessment: no headache and no apparent nausea or vomiting Anesthetic complications: no Comments: Late entry   No complications documented.   Last Vitals:  Vitals:   01/24/21 1130 01/24/21 1145  BP: 116/74 126/82  Pulse: 69 67  Resp: 15 16  Temp: (!) 36.4 C   SpO2: 92% 99%    Last Pain:  Vitals:   01/24/21 1157  TempSrc:   PainSc: Datil

## 2021-01-24 NOTE — Anesthesia Preprocedure Evaluation (Signed)
Anesthesia Evaluation  Patient identified by MRN, date of birth, ID band Patient awake    Reviewed: Allergy & Precautions, H&P , NPO status , Patient's Chart, lab work & pertinent test results, reviewed documented beta blocker date and time   Airway Mallampati: II  TM Distance: >3 FB Neck ROM: full    Dental no notable dental hx. (+) Teeth Intact   Pulmonary sleep apnea , former smoker,    Pulmonary exam normal breath sounds clear to auscultation       Cardiovascular Exercise Tolerance: Good hypertension, negative cardio ROS   Rhythm:regular Rate:Normal     Neuro/Psych negative neurological ROS  negative psych ROS   GI/Hepatic negative GI ROS, Neg liver ROS,   Endo/Other  negative endocrine ROS  Renal/GU negative Renal ROS  negative genitourinary   Musculoskeletal   Abdominal   Peds  Hematology negative hematology ROS (+)   Anesthesia Other Findings   Reproductive/Obstetrics negative OB ROS                             Anesthesia Physical Anesthesia Plan  ASA: II  Anesthesia Plan: General and General LMA   Post-op Pain Management:    Induction:   PONV Risk Score and Plan: Ondansetron  Airway Management Planned:   Additional Equipment:   Intra-op Plan:   Post-operative Plan:   Informed Consent: I have reviewed the patients History and Physical, chart, labs and discussed the procedure including the risks, benefits and alternatives for the proposed anesthesia with the patient or authorized representative who has indicated his/her understanding and acceptance.     Dental Advisory Given  Plan Discussed with: CRNA  Anesthesia Plan Comments:         Anesthesia Quick Evaluation

## 2021-01-24 NOTE — Op Note (Signed)
[facility] Operative Note  01/24/21  Preoperative Diagnosis: Right inguinal hernia    Postoperative Diagnosis: Same   Procedure(s) Performed: Right inguinal hernia repair with mesh   Surgeon: Lanell Matar. Constance Haw, MD   Assistants: No qualified resident was available   Anesthesia: General endotracheal   Anesthesiologist: Dr. Briant Cedar   Specimens: None    Estimated Blood Loss: Minimal   Blood Replacement: None    Complications: None   Wound Class: Clean    Operative Indications: [name] is a 65 yo who has a right inguinal hernia that has been getting larger and bothering him. We discussed repair and use of mesh. We discussed risk of bleeding, infection, and recurrence.    Findings: Indirect hernia with cord lipoma    Procedure: The patient was taken to the operating room and placed supine. General endotracheal anesthesia was induced. Intravenous antibiotics were administered per protocol.  A time out was preformed verifying the correct patient, procedure, site, positioning and implants.  The right  groin and scrotum were prepared and draped in the usual sterile fashion.   An incision was made in a natural skin crease between the pubic tubercle and the anterior superior iliac spine.  The incision was deepened with electrocautery through Scarpa's and Camper's fascia until the aponeurosis of the external oblique was encountered.  This was cleaned and the external ring was exposed.  An incision was made in the midportion of the external oblique aponeurosis in the direction of its fibers. The ilioinguinal nerve was identified and was protected throughout the dissection.  Flaps of the external oblique were developed cephalad and inferiorly.    The cord was identified and it was gently dissented free at the pubic tubercle and encircled with a Penrose drain.  Attention was then directed at the anteromedial aspect of the cord, where an indirect hernia sac was identified.   The sac was carefully dissected free from the cord down to the level of the internal ring.  The vas and testicular vessels were identified and protected from harm.  Once the sac was dissected free from the cords, the Penrose was placed around the cord which was retracted inferiorly. A cord lipoma was noted and was excised with cautery and tied off with a 3-0 Vicryl suture. The cord was then retracted out of the field of view.  The hernia was reduced into the internal ring without difficulty.  A Medium Perfix Plug was placed into the defect and filled the space.  Attention was then turned to the floor of the canal, which was grossly weakened without any defined defect or sac.  The Perfix Mesh Patch was sutured to the inguinal ligament inferiorly starting at the pubic tubercle using 2-0 Novafil interrupted sutures.  The mesh was sutured superiorly to the conjoint tendon using 2-0 Novafil interrupted sutures.  Care was taken to ensure the mesh was placed in a relaxed fashion to avoid excessive tension and no neurovascular structures were caught in the repair.  Laterally the tails of the mesh were crossed and the internal ring was recreated, allowing for passage of cords without tension.   Hemostasis was adequate.  The Penrose was removed.  The external oblique aponeurosis was closed with a 2-0 Vicryl suture in a running fashion, taking care to not catch the ilioinguinal nerve in the suture line.  Scarpa's fashion was closed with 3-0 Vicryl interrupted sutures. Exparel was injected. The skin was closed with a subcuticular 4-0 Monocryl suture.  Dermabond was applied.  The testis was gently pulled down into its anatomic position in the scrotum.  The patient tolerated the procedure well and was taken to the PACU in stable condition. All counts were correct at the end of the case.        Curlene Labrum, MD [facility] [address] [place], [place] [id number] [phone]  (office)

## 2021-01-24 NOTE — Discharge Instructions (Signed)
Discharge Instructions Hernia:  Common Complaints: Pain at the incision site is common. This will improve with time. Take your pain medications as described below. Some nausea is common and poor appetite. The main goal is to stay hydrated the first few days after surgery.  Numbness at the incision or the thigh is common.  You may have some bruising in the incision and around the scrotum. You can expect some swelling. If you area concerned please call the office and let us know.  Keep the scrotum elevated with a folded towel and ice to the area for first 72 hours.  If you start to have burning or tingling pain in your groin, this is from a nerve being pinched. Please call and we can prescribe you a different type of pain medication for nerve pain.   Diet/ Activity: Diet as tolerated. You may not have an appetite, but it is important to stay hydrated. Drink 64 ounces of water a day. Your appetite will return with time.  Shower per your regular routine daily.  Do not take hot showers. Take warm showers that are less than 10 minutes. Rest and listen to your body, but do not remain in bed all day.  Walk everyday for at least 15-20 minutes. Deep cough and move around every 1-2 hours in the first few days after surgery.  Do not pick at the dermabond glue on your incision sites.  This glue film will remain in place for 1-2 weeks and will start to peel off.  Do not place lotions or balms on your incision unless instructed to specifically by Dr. Constance Haw.  Do not lift > 10 lbs, perform excessive bending, pushing, pulling, squatting for 6-8 weeks after surgery.   Pain Expectations and Narcotics: -After surgery you will have pain associated with your incisions and this is normal. The pain is muscular and nerve pain, and will get better with time. -You are encouraged and expected to take non narcotic medications like tylenol and ibuprofen (when able) to treat pain as multiple modalities can aid with pain  treatment. -Narcotics are only used when pain is severe or there is breakthrough pain. -You are not expected to have a pain score of 0 after surgery, as we cannot prevent pain. A pain score of 3-4 that allows you to be functional, move, walk, and tolerate some activity is the goal. The pain will continue to improve over the days after surgery and is dependent on your surgery. -Due to [place] law, we are only able to give a certain amount of pain medication to treat post operative pain, and we only give additional narcotics on a patient by patient basis.  -For most laparoscopic surgery, studies have shown that the majority of patients only need 10-15 narcotic pills, and for open surgeries most patients only need 15-20.   -Having appropriate expectations of pain and knowledge of pain management with non narcotics is important as we do not want anyone to become addicted to narcotic pain medication.  -Using ice packs in the first 48 hours and heating pads after 48 hours, wearing an abdominal binder (when recommended), and using over the counter medications are all ways to help with pain management.   -Simple acts like meditation and mindfulness practices after surgery can also help with pain control and research has proven the benefit of these practices.  Medication: Take tylenol and ibuprofen as needed for pain control, alternating every 4-6 hours.  Example:  Tylenol 1000mg  @ 6am, 12noon, 6pm, 61midnight (  Do not exceed 4000mg  of tylenol a day). Ibuprofen 800mg  @ 9am, 3pm, 9pm, 3am (Do not exceed 3600mg  of ibuprofen a day).  Take Roxicodone for breakthrough pain every 4 hours.  Take Colace for constipation related to narcotic pain medication. If you do not have a bowel movement in 2 days, take Miralax over the counter.  Drink plenty of water to also prevent constipation.   Contact Information: If you have questions or concerns, please call our office, [phone], Monday- Thursday 8AM-5PM and Friday  8AM-12Noon.  If it is after hours or on the weekend, please call [facility], [phone], [phone], and ask to speak to the surgeon on call for Dr. Constance Haw at [facility].    Open Hernia Repair, Adult, Care After What can I expect after the procedure? After the procedure, it is common to have:  Mild discomfort.  Slight bruising.  Mild swelling.  Pain in the belly (abdomen).  A small amount of blood from the cut from surgery (incision). Follow these instructions at home: Your doctor may give you more specific instructions. If you have problems, call your doctor. Medicines  Take over-the-counter and prescription medicines only as told by your doctor.  If told, take steps to prevent problems with pooping (constipation). You may need to: ? Drink enough fluid to keep your pee (urine) pale yellow. ? Take medicines. You will be told what medicines to take. ? Eat foods that are high in fiber. These include beans, whole grains, and fresh fruits and vegetables. ? Limit foods that are high in fat and sugar. These include fried or sweet foods.  Ask your doctor if you should avoid driving or using machines while you are taking your medicine. Incision care  Follow instructions from your doctor about how to take care of your incision. Make sure you: ? Wash your hands with soap and water for at least 20 seconds before and after you change your bandage (dressing). If you cannot use soap and water, use hand sanitizer. ? Change your bandage. ? Leave stitches or skin glue in place for at least 2 weeks. ? Leave tape strips alone unless you are told to take them off. You may trim the edges of the tape strips if they curl up.  Check your incision every day for signs of infection. Check for: ? More redness, swelling, or pain. ? More fluid or blood. ? Warmth. ? Pus or a bad smell.  Wear loose, soft clothing while your incision heals.   Activity  Rest as told by your doctor.  Do  not lift anything that is heavier than 10 lb (4.5 kg), or the limit that you are told.  Do not play contact sports until your doctor says that this is safe.  If you were given a sedative during your procedure, do not drive or use machines until your doctor says that it is safe. A sedative is a medicine that helps you relax.  Return to your normal activities when your doctor says that it is safe.   General instructions  Do not take baths, swim, or use a hot tub. You may shower.  Hold a pillow over your belly when you cough or sneeze. This helps with pain.  Do not smoke or use any products that contain nicotine or tobacco. If you need help quitting, ask your doctor.  Keep all follow-up visits. Contact a doctor if:  You have any of these signs of infection in or around your incision: ? More redness,  swelling, or pain. ? More fluid or blood. ? Warmth. ? Pus. ? A bad smell.  You have a fever or chills.  You have blood in your poop (stool).  You have not pooped (had a bowel movement) in 2-3 days.  Medicine does not help your pain. Get help right away if:  You have chest pain, or you are short of breath.  You feel faint or light-headed.  You have very bad pain.  You vomit and your pain is worse.  You have pain, swelling, or redness in a leg. These symptoms may be an emergency. Get help right away. Call your local emergency services (911 in the [place]  Do not wait to see if the symptoms will go away.  Do not drive yourself to the hospital. Summary  After this procedure, it is common to have mild discomfort, slight bruising, and mild swelling.  Follow instructions from your doctor about how to take care of your cut from surgery (incision). Check every day for signs of infection.  Do not lift heavy objects or play contact sports until your doctor says it is safe.  Return to your normal activities as told by your doctor. This information is not intended to replace advice  given to you by your health care provider. Make sure you discuss any questions you have with your health care provider. Document Revised: 04/10/2020 Document Reviewed: 04/10/2020 Elsevier Patient Education  2021 [place] Anesthesia, Adult, Care After This sheet gives you information about how to care for yourself after your procedure. Your health care provider may also give you more specific instructions. If you have problems or questions, contact your health care provider. What can I expect after the procedure? After the procedure, the following side effects are common:  Pain or discomfort at the IV site.  Nausea.  Vomiting.  Sore throat.  Trouble concentrating.  Feeling cold or chills.  Feeling weak or tired.  Sleepiness and fatigue.  Soreness and body aches. These side effects can affect parts of the body that were not involved in surgery. Follow these instructions at home: For the time period you were told by your health care provider:  Rest.  Do not participate in activities where you could fall or become injured.  Do not drive or use machinery.  Do not drink alcohol.  Do not take sleeping pills or medicines that cause drowsiness.  Do not make important decisions or sign legal documents.  Do not take care of children on your own.   Eating and drinking  Follow any instructions from your health care provider about eating or drinking restrictions.  When you feel hungry, start by eating small amounts of foods that are soft and easy to digest (bland), such as toast. Gradually return to your regular diet.  Drink enough fluid to keep your urine pale yellow.  If you vomit, rehydrate by drinking water, juice, or clear broth. General instructions  If you have sleep apnea, surgery and certain medicines can increase your risk for breathing problems. Follow instructions from your health care provider about wearing your sleep device: ? Anytime you are  sleeping, including during daytime naps. ? While taking prescription pain medicines, sleeping medicines, or medicines that make you drowsy.  Have a responsible adult stay with you for the time you are told. It is important to have someone help care for you until you are awake and alert.  Return to your normal activities as told by your health  care provider. Ask your health care provider what activities are safe for you.  Take over-the-counter and prescription medicines only as told by your health care provider.  If you smoke, do not smoke without supervision.  Keep all follow-up visits as told by your health care provider. This is important. Contact a health care provider if:  You have nausea or vomiting that does not get better with medicine.  You cannot eat or drink without vomiting.  You have pain that does not get better with medicine.  You are unable to pass urine.  You develop a skin rash.  You have a fever.  You have redness around your IV site that gets worse. Get help right away if:  You have difficulty breathing.  You have chest pain.  You have blood in your urine or stool, or you vomit blood. Summary  After the procedure, it is common to have a sore throat or nausea. It is also common to feel tired.  Have a responsible adult stay with you for the time you are told. It is important to have someone help care for you until you are awake and alert.  When you feel hungry, start by eating small amounts of foods that are soft and easy to digest (bland), such as toast. Gradually return to your regular diet.  Drink enough fluid to keep your urine pale yellow.  Return to your normal activities as told by your health care provider. Ask your health care provider what activities are safe for you. This information is not intended to replace advice given to you by your health care provider. Make sure you discuss any questions you have with your health care provider. Document  Revised: 05/11/2020 Document Reviewed: 12/09/2019 Elsevier Patient Education  2021 [place] UNTIL Sunday Jan 28, 2021. DO NOT USE NUMBING MEDICATIONS WITHOUT CONSULTING A PHYSICIAN UNTIL AFTER Sunday   Bupivacaine Liposomal Suspension for Injection What is this medicine? BUPIVACAINE LIPOSOMAL (bue PIV a kane LIP oh som al) is an anesthetic. It causes loss of feeling in the skin or other tissues. It is used to prevent and to treat pain from some procedures. This medicine may be used for other purposes; ask your health care provider or pharmacist if you have questions. COMMON BRAND NAME(S): EXPAREL What should I tell my health care provider before I take this medicine? They need to know if you have any of these conditions:  G6PD deficiency  heart disease  kidney disease  liver disease  low blood pressure  lung or breathing disease, like asthma  an unusual or allergic reaction to bupivacaine, other medicines, foods, dyes, or preservatives  pregnant or trying to get pregnant  breast-feeding How should I use this medicine? This medicine is injected into the affected area. It is given by a health care provider in a hospital or clinic setting. Talk to your health care provider about the use of this medicine in children. While it may be given to children as young as 6 years for selected conditions, precautions do apply. Overdosage: If you think you have taken too much of this medicine contact a poison control center or emergency room at once. NOTE: This medicine is only for you. Do not share this medicine with others. What if I miss a dose? This does not apply. What may interact with this medicine? This medicine may interact with the following medications:  acetaminophen  certain antibiotics like dapsone, nitrofurantoin, aminosalicylic acid, sulfonamides  certain medicines for seizures like phenobarbital, phenytoin, valproic  acid  chloroquine  cyclophosphamide  flutamide  hydroxyurea  ifosfamide  metoclopramide  nitric oxide  nitroglycerin  nitroprusside  nitrous oxide  other local anesthetics like lidocaine, pramoxine, tetracaine  primaquine  quinine  rasburicase  sulfasalazine This list may not describe all possible interactions. Give your health care provider a list of all the medicines, herbs, non-prescription drugs, or dietary supplements you use. Also tell them if you smoke, drink alcohol, or use illegal drugs. Some items may interact with your medicine. What should I watch for while using this medicine? Your condition will be monitored carefully while you are receiving this medicine. Be careful to avoid injury while the area is numb, and you are not aware of pain. What side effects may I notice from receiving this medicine? Side effects that you should report to your doctor or health care professional as soon as possible:  allergic reactions like skin rash, itching or hives, swelling of the face, lips, or tongue  seizures  signs and symptoms of a dangerous change in heartbeat or heart rhythm like chest pain; dizziness; fast, irregular heartbeat; palpitations; feeling faint or lightheaded; falls; breathing problems  signs and symptoms of methemoglobinemia such as pale, gray, or blue colored skin; headache; fast heartbeat; shortness of breath; feeling faint or lightheaded, falls; tiredness Side effects that usually do not require medical attention (report to your doctor or health care professional if they continue or are bothersome):  anxious  back pain  changes in taste  changes in vision  constipation  dizziness  fever  nausea, vomiting This list may not describe all possible side effects. Call your doctor for medical advice about side effects. You may report side effects to FDA at 1-800-FDA-1088. Where should I keep my medicine? This drug is given in a hospital or  clinic and will not be stored at home. NOTE: This sheet is a summary. It may not cover all possible information. If you have questions about this medicine, talk to your doctor, pharmacist, or health care provider.  2021 Elsevier/Gold Standard (2019-12-02 12:24:57)

## 2021-01-24 NOTE — Transfer of Care (Signed)
Immediate Anesthesia Transfer of Care Note  Patient: Gregory Medina  Procedure(s) Performed: HERNIA REPAIR INGUINAL ADULT W/MESH (Right Groin)  Patient Location: PACU  Anesthesia Type:General  Level of Consciousness: drowsy  Airway & Oxygen Therapy: Patient Spontanous Breathing and Patient connected to nasal cannula oxygen  Post-op Assessment: Report given to RN and Post -op Vital signs reviewed and stable  Post vital signs: Reviewed and stable  Last Vitals:  Vitals Value Taken Time  BP 116/74 01/24/21 1130  Temp    Pulse 69 01/24/21 1130  Resp 16 01/24/21 1130  SpO2 91 % 01/24/21 1130  Vitals shown include unvalidated device data.  Last Pain:  Vitals:   01/24/21 0755  TempSrc: Oral  PainSc: 0-No pain         Complications: No complications documented.

## 2021-01-25 ENCOUNTER — Encounter (HOSPITAL_COMMUNITY): Payer: Self-pay | Admitting: General Surgery

## 2021-02-19 ENCOUNTER — Other Ambulatory Visit (HOSPITAL_COMMUNITY): Payer: Self-pay

## 2021-02-19 ENCOUNTER — Other Ambulatory Visit (HOSPITAL_COMMUNITY): Payer: Self-pay | Admitting: Family Medicine

## 2021-02-20 ENCOUNTER — Other Ambulatory Visit (HOSPITAL_COMMUNITY): Payer: Self-pay

## 2021-02-20 ENCOUNTER — Encounter: Payer: Self-pay | Admitting: General Surgery

## 2021-02-20 ENCOUNTER — Other Ambulatory Visit: Payer: Self-pay

## 2021-02-20 ENCOUNTER — Ambulatory Visit (INDEPENDENT_AMBULATORY_CARE_PROVIDER_SITE_OTHER): Payer: Medicare Other | Admitting: General Surgery

## 2021-02-20 VITALS — BP 153/84 | HR 56 | Temp 98.6°F | Resp 12 | Ht 69.0 in | Wt 206.0 lb

## 2021-02-20 DIAGNOSIS — R7301 Impaired fasting glucose: Secondary | ICD-10-CM | POA: Diagnosis not present

## 2021-02-20 DIAGNOSIS — K2100 Gastro-esophageal reflux disease with esophagitis, without bleeding: Secondary | ICD-10-CM | POA: Diagnosis not present

## 2021-02-20 DIAGNOSIS — I10 Essential (primary) hypertension: Secondary | ICD-10-CM | POA: Diagnosis not present

## 2021-02-20 DIAGNOSIS — E782 Mixed hyperlipidemia: Secondary | ICD-10-CM | POA: Diagnosis not present

## 2021-02-20 DIAGNOSIS — E7849 Other hyperlipidemia: Secondary | ICD-10-CM | POA: Diagnosis not present

## 2021-02-20 DIAGNOSIS — K4090 Unilateral inguinal hernia, without obstruction or gangrene, not specified as recurrent: Secondary | ICD-10-CM

## 2021-02-20 DIAGNOSIS — Z0001 Encounter for general adult medical examination with abnormal findings: Secondary | ICD-10-CM | POA: Diagnosis not present

## 2021-02-20 DIAGNOSIS — E7800 Pure hypercholesterolemia, unspecified: Secondary | ICD-10-CM | POA: Diagnosis not present

## 2021-02-20 MED ORDER — LOSARTAN POTASSIUM 50 MG PO TABS
ORAL_TABLET | ORAL | 11 refills | Status: DC
  Filled 2021-02-20: qty 30, 30d supply, fill #0
  Filled 2021-03-19: qty 30, 30d supply, fill #1
  Filled 2021-04-18: qty 30, 30d supply, fill #2
  Filled 2021-05-16: qty 30, 30d supply, fill #3
  Filled 2021-06-13: qty 30, 30d supply, fill #4
  Filled 2021-07-17: qty 30, 30d supply, fill #5
  Filled 2021-08-16: qty 30, 30d supply, fill #6
  Filled 2021-09-14: qty 30, 30d supply, fill #7
  Filled 2021-10-09: qty 30, 30d supply, fill #8
  Filled 2021-11-01: qty 30, 30d supply, fill #9
  Filled 2021-11-13: qty 90, 90d supply, fill #9

## 2021-02-20 NOTE — Patient Instructions (Signed)
No heavy lifting > 10 lbs, excessive bending, pushing, pulling, or squatting for 6-8 weeks after surgery.  No swimming in a lake for at least 2 more weeks. Shower after.

## 2021-02-20 NOTE — Progress Notes (Signed)
[facility] Note   HPI:  65 y.o. Male presents to clinic for post-op follow-up evaluation of right inguinal hernia repair. Patient reports doing well and having minor soreness at times. He says he is otherwise well. He did have some constipation right after surgery but that is resolved. Bruising and swelling resolved.   Review of Systems:  No drainage No redness All other review of systems: otherwise negative   Vital Signs:  BP (!) 153/84   Pulse (!) 56   Temp 98.6 F (37 C) (Other (Comment))   Resp 12   Ht 5\' 9"  (1.753 m)   Wt 206 lb (93.4 kg)   SpO2 94%   BMI 30.42 kg/m    Physical Exam:  Physical Exam Cardiovascular:     Rate and Rhythm: Normal rate.  Pulmonary:     Effort: Pulmonary effort is normal.  Abdominal:     General: There is no distension.     Palpations: Abdomen is soft.     Tenderness: There is no abdominal tenderness.     Hernia: No hernia is present.     Comments: Right inguinal incision healing, no erythema or drainage, induration  Neurological:     Mental Status: He is alert.     Assessment:  65 y.o. yo Male with s/p inguinal hernia repair with mesh on the right.  Plan:  No heavy lifting > 10 lbs, excessive bending, pushing, pulling, or squatting for 6-8 weeks after surgery.  No swimming in a lake for at least 2 more weeks. Shower after.  PRN follow up  Curlene Labrum, MD [facility] [address] [place], [place] [phone] (office)

## 2021-02-23 DIAGNOSIS — M545 Low back pain, unspecified: Secondary | ICD-10-CM | POA: Diagnosis not present

## 2021-02-23 DIAGNOSIS — D485 Neoplasm of uncertain behavior of skin: Secondary | ICD-10-CM | POA: Diagnosis not present

## 2021-02-23 DIAGNOSIS — Z23 Encounter for immunization: Secondary | ICD-10-CM | POA: Diagnosis not present

## 2021-02-23 DIAGNOSIS — Z0001 Encounter for general adult medical examination with abnormal findings: Secondary | ICD-10-CM | POA: Diagnosis not present

## 2021-03-06 DIAGNOSIS — G4733 Obstructive sleep apnea (adult) (pediatric): Secondary | ICD-10-CM | POA: Diagnosis not present

## 2021-03-19 ENCOUNTER — Other Ambulatory Visit (HOSPITAL_COMMUNITY): Payer: Self-pay

## 2021-03-19 MED FILL — Pravastatin Sodium Tab 20 MG: ORAL | 90 days supply | Qty: 90 | Fill #1 | Status: AC

## 2021-03-19 MED FILL — Metoprolol Succinate Tab ER 24HR 100 MG (Tartrate Equiv): ORAL | 90 days supply | Qty: 90 | Fill #1 | Status: AC

## 2021-04-19 ENCOUNTER — Other Ambulatory Visit (HOSPITAL_COMMUNITY): Payer: Self-pay

## 2021-05-16 ENCOUNTER — Other Ambulatory Visit (HOSPITAL_COMMUNITY): Payer: Self-pay

## 2021-05-23 ENCOUNTER — Ambulatory Visit: Payer: Medicare Other | Admitting: Dermatology

## 2021-05-23 ENCOUNTER — Other Ambulatory Visit: Payer: Self-pay

## 2021-05-23 DIAGNOSIS — Z85828 Personal history of other malignant neoplasm of skin: Secondary | ICD-10-CM | POA: Diagnosis not present

## 2021-05-23 DIAGNOSIS — D1800 Hemangioma unspecified site: Secondary | ICD-10-CM

## 2021-05-23 DIAGNOSIS — Z1283 Encounter for screening for malignant neoplasm of skin: Secondary | ICD-10-CM | POA: Diagnosis not present

## 2021-05-23 DIAGNOSIS — L738 Other specified follicular disorders: Secondary | ICD-10-CM | POA: Diagnosis not present

## 2021-05-23 MED ORDER — CLOBETASOL PROP EMOLLIENT BASE 0.05 % EX CREA: TOPICAL_CREAM | CUTANEOUS | 3 refills | Status: DC

## 2021-06-03 ENCOUNTER — Encounter: Payer: Self-pay | Admitting: Dermatology

## 2021-06-03 NOTE — Progress Notes (Signed)
   New Patient   Subjective  Gregory Medina is a 65 y.o. male who presents for the following: Skin Problem (Has a lesion on left thigh area x 1 year. No bleeding. Also has red rash on lower legs that is worse in the winter. Get scaly per patient. Doing pretty we with OTC eczema cream. History of non mole skin cancers. ).  Rash on legs plus general skin check Location:  Duration:  Quality:  Associated Signs/Symptoms: Modifying Factors:  Severity:  Timing: Context:    The following portions of the chart were reviewed this encounter and updated as appropriate:  Tobacco  Allergies  Meds  Problems  Med Hx  Surg Hx  Fam Hx      Objective  Well appearing patient in no apparent distress; mood and affect are within normal limits. 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    A full examination was performed including scalp, head, eyes, ears, nose, lips, neck, chest, axillae, abdomen, back, buttocks, bilateral upper extremities, bilateral lower extremities, hands, feet, fingers, toes, fingernails, and toenails. All findings within normal limits unless otherwise noted below.  Areas beneath undergarments not examined.   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  Discussed  ways to minimize scratching.  Clobetasol Prop Emollient Base (CLOBETASOL PROPIONATE E) 0.05 % emollient cream - Left Lower Leg - Anterior, Right Lower Leg - Anterior Apply to affected area qd

## 2021-06-13 MED FILL — Pravastatin Sodium Tab 20 MG: ORAL | 90 days supply | Qty: 90 | Fill #2 | Status: AC

## 2021-06-13 MED FILL — Metoprolol Succinate Tab ER 24HR 100 MG (Tartrate Equiv): ORAL | 90 days supply | Qty: 90 | Fill #2 | Status: AC

## 2021-06-14 ENCOUNTER — Other Ambulatory Visit (HOSPITAL_COMMUNITY): Payer: Self-pay

## 2021-07-07 DIAGNOSIS — R059 Cough, unspecified: Secondary | ICD-10-CM | POA: Diagnosis not present

## 2021-07-17 ENCOUNTER — Other Ambulatory Visit (HOSPITAL_COMMUNITY): Payer: Self-pay

## 2021-08-06 DIAGNOSIS — G4733 Obstructive sleep apnea (adult) (pediatric): Secondary | ICD-10-CM | POA: Diagnosis not present

## 2021-08-17 ENCOUNTER — Other Ambulatory Visit (HOSPITAL_COMMUNITY): Payer: Self-pay

## 2021-09-10 ENCOUNTER — Other Ambulatory Visit (HOSPITAL_COMMUNITY): Payer: Self-pay

## 2021-09-10 MED ORDER — PRAVASTATIN SODIUM 20 MG PO TABS
ORAL_TABLET | ORAL | 3 refills | Status: DC
  Filled 2021-09-10: qty 90, 90d supply, fill #0
  Filled 2021-11-29: qty 90, 90d supply, fill #1
  Filled 2022-03-11: qty 90, 90d supply, fill #2
  Filled 2022-06-07: qty 90, 90d supply, fill #3

## 2021-09-10 MED ORDER — METOPROLOL SUCCINATE ER 100 MG PO TB24
ORAL_TABLET | ORAL | 3 refills | Status: DC
  Filled 2021-09-10: qty 90, 90d supply, fill #0
  Filled 2021-11-29: qty 90, 90d supply, fill #1
  Filled 2022-03-11: qty 90, 90d supply, fill #2
  Filled 2022-06-07: qty 90, 90d supply, fill #3

## 2021-09-15 ENCOUNTER — Other Ambulatory Visit (HOSPITAL_COMMUNITY): Payer: Self-pay

## 2021-09-20 DIAGNOSIS — H04123 Dry eye syndrome of bilateral lacrimal glands: Secondary | ICD-10-CM | POA: Diagnosis not present

## 2021-09-20 DIAGNOSIS — H0102A Squamous blepharitis right eye, upper and lower eyelids: Secondary | ICD-10-CM | POA: Diagnosis not present

## 2021-09-20 DIAGNOSIS — H31002 Unspecified chorioretinal scars, left eye: Secondary | ICD-10-CM | POA: Diagnosis not present

## 2021-09-20 DIAGNOSIS — H0102B Squamous blepharitis left eye, upper and lower eyelids: Secondary | ICD-10-CM | POA: Diagnosis not present

## 2021-10-10 ENCOUNTER — Other Ambulatory Visit (HOSPITAL_COMMUNITY): Payer: Self-pay

## 2021-10-23 ENCOUNTER — Other Ambulatory Visit (HOSPITAL_COMMUNITY): Payer: Self-pay

## 2021-11-01 ENCOUNTER — Other Ambulatory Visit (HOSPITAL_COMMUNITY): Payer: Self-pay

## 2021-11-06 ENCOUNTER — Other Ambulatory Visit (HOSPITAL_BASED_OUTPATIENT_CLINIC_OR_DEPARTMENT_OTHER): Payer: Self-pay

## 2021-11-08 ENCOUNTER — Other Ambulatory Visit (HOSPITAL_COMMUNITY): Payer: Self-pay

## 2021-11-14 ENCOUNTER — Other Ambulatory Visit (HOSPITAL_COMMUNITY): Payer: Self-pay

## 2021-11-15 ENCOUNTER — Other Ambulatory Visit (HOSPITAL_COMMUNITY): Payer: Self-pay

## 2021-11-29 ENCOUNTER — Other Ambulatory Visit (HOSPITAL_COMMUNITY): Payer: Self-pay

## 2021-12-18 DIAGNOSIS — G4733 Obstructive sleep apnea (adult) (pediatric): Secondary | ICD-10-CM | POA: Diagnosis not present

## 2022-02-12 ENCOUNTER — Other Ambulatory Visit (HOSPITAL_COMMUNITY): Payer: Self-pay

## 2022-02-12 MED ORDER — LOSARTAN POTASSIUM 50 MG PO TABS
ORAL_TABLET | ORAL | 11 refills | Status: DC
  Filled 2022-02-12: qty 30, 30d supply, fill #0
  Filled 2022-03-11: qty 30, 30d supply, fill #1
  Filled 2022-04-12: qty 30, 30d supply, fill #2
  Filled 2022-05-13: qty 30, 30d supply, fill #3
  Filled 2022-06-07: qty 30, 30d supply, fill #4
  Filled 2022-07-12: qty 30, 30d supply, fill #5
  Filled 2022-08-12: qty 30, 30d supply, fill #6
  Filled 2022-09-11: qty 30, 30d supply, fill #7
  Filled 2022-10-07: qty 30, 30d supply, fill #8
  Filled 2022-11-08 – 2022-11-14 (×2): qty 30, 30d supply, fill #9
  Filled 2022-12-09: qty 30, 30d supply, fill #10
  Filled 2023-01-07: qty 30, 30d supply, fill #11

## 2022-02-21 DIAGNOSIS — E7800 Pure hypercholesterolemia, unspecified: Secondary | ICD-10-CM | POA: Diagnosis not present

## 2022-02-21 DIAGNOSIS — E7849 Other hyperlipidemia: Secondary | ICD-10-CM | POA: Diagnosis not present

## 2022-02-21 DIAGNOSIS — E782 Mixed hyperlipidemia: Secondary | ICD-10-CM | POA: Diagnosis not present

## 2022-02-21 DIAGNOSIS — I10 Essential (primary) hypertension: Secondary | ICD-10-CM | POA: Diagnosis not present

## 2022-02-21 DIAGNOSIS — R7301 Impaired fasting glucose: Secondary | ICD-10-CM | POA: Diagnosis not present

## 2022-02-21 DIAGNOSIS — K2100 Gastro-esophageal reflux disease with esophagitis, without bleeding: Secondary | ICD-10-CM | POA: Diagnosis not present

## 2022-02-25 DIAGNOSIS — E7849 Other hyperlipidemia: Secondary | ICD-10-CM | POA: Diagnosis not present

## 2022-02-25 DIAGNOSIS — Z0001 Encounter for general adult medical examination with abnormal findings: Secondary | ICD-10-CM | POA: Diagnosis not present

## 2022-02-25 DIAGNOSIS — Z23 Encounter for immunization: Secondary | ICD-10-CM | POA: Diagnosis not present

## 2022-03-11 ENCOUNTER — Other Ambulatory Visit (HOSPITAL_COMMUNITY): Payer: Self-pay

## 2022-04-12 ENCOUNTER — Other Ambulatory Visit (HOSPITAL_COMMUNITY): Payer: Self-pay

## 2022-04-18 DIAGNOSIS — Z23 Encounter for immunization: Secondary | ICD-10-CM | POA: Diagnosis not present

## 2022-05-14 ENCOUNTER — Other Ambulatory Visit (HOSPITAL_COMMUNITY): Payer: Self-pay

## 2022-06-07 ENCOUNTER — Other Ambulatory Visit (HOSPITAL_COMMUNITY): Payer: Self-pay

## 2022-06-19 DIAGNOSIS — G4733 Obstructive sleep apnea (adult) (pediatric): Secondary | ICD-10-CM | POA: Diagnosis not present

## 2022-07-13 ENCOUNTER — Other Ambulatory Visit (HOSPITAL_COMMUNITY): Payer: Self-pay

## 2022-08-13 ENCOUNTER — Other Ambulatory Visit (HOSPITAL_COMMUNITY): Payer: Self-pay

## 2022-09-03 ENCOUNTER — Other Ambulatory Visit: Payer: Self-pay

## 2022-09-03 ENCOUNTER — Other Ambulatory Visit (HOSPITAL_COMMUNITY): Payer: Self-pay

## 2022-09-03 MED ORDER — PRAVASTATIN SODIUM 20 MG PO TABS
20.0000 mg | ORAL_TABLET | Freq: Every day | ORAL | 3 refills | Status: DC
  Filled 2022-09-03 (×2): qty 90, 90d supply, fill #0
  Filled 2022-12-05 – 2022-12-06 (×2): qty 90, 90d supply, fill #1
  Filled 2023-03-08: qty 90, 90d supply, fill #2
  Filled 2023-06-09: qty 90, 90d supply, fill #3

## 2022-09-03 MED ORDER — METOPROLOL SUCCINATE ER 100 MG PO TB24
100.0000 mg | ORAL_TABLET | Freq: Every day | ORAL | 3 refills | Status: DC
  Filled 2022-09-03 (×2): qty 90, 90d supply, fill #0
  Filled 2022-12-05 – 2022-12-06 (×2): qty 90, 90d supply, fill #1
  Filled 2023-03-08: qty 90, 90d supply, fill #2
  Filled 2023-06-09: qty 90, 90d supply, fill #3

## 2022-09-11 ENCOUNTER — Other Ambulatory Visit (HOSPITAL_COMMUNITY): Payer: Self-pay

## 2022-10-08 ENCOUNTER — Other Ambulatory Visit (HOSPITAL_COMMUNITY): Payer: Self-pay

## 2022-11-09 ENCOUNTER — Other Ambulatory Visit (HOSPITAL_COMMUNITY): Payer: Self-pay

## 2022-11-14 ENCOUNTER — Other Ambulatory Visit (HOSPITAL_COMMUNITY): Payer: Self-pay

## 2022-11-18 ENCOUNTER — Other Ambulatory Visit (HOSPITAL_COMMUNITY): Payer: Self-pay

## 2022-12-06 ENCOUNTER — Other Ambulatory Visit (HOSPITAL_COMMUNITY): Payer: Self-pay

## 2022-12-06 ENCOUNTER — Other Ambulatory Visit: Payer: Self-pay

## 2022-12-10 ENCOUNTER — Other Ambulatory Visit (HOSPITAL_COMMUNITY): Payer: Self-pay

## 2022-12-19 DIAGNOSIS — G4733 Obstructive sleep apnea (adult) (pediatric): Secondary | ICD-10-CM | POA: Diagnosis not present

## 2023-01-08 ENCOUNTER — Other Ambulatory Visit (HOSPITAL_COMMUNITY): Payer: Self-pay

## 2023-02-07 ENCOUNTER — Other Ambulatory Visit (HOSPITAL_COMMUNITY): Payer: Self-pay

## 2023-02-07 ENCOUNTER — Other Ambulatory Visit: Payer: Self-pay

## 2023-02-07 MED ORDER — LOSARTAN POTASSIUM 50 MG PO TABS
50.0000 mg | ORAL_TABLET | Freq: Every day | ORAL | 12 refills | Status: AC
  Filled 2023-02-07: qty 30, 30d supply, fill #0
  Filled 2023-03-08: qty 30, 30d supply, fill #1
  Filled 2023-04-08: qty 30, 30d supply, fill #2
  Filled 2023-05-12: qty 30, 30d supply, fill #3
  Filled 2023-06-09: qty 30, 30d supply, fill #4
  Filled 2023-07-11: qty 30, 30d supply, fill #5
  Filled 2023-08-08: qty 30, 30d supply, fill #6
  Filled 2023-09-08: qty 30, 30d supply, fill #7
  Filled 2023-10-08 – 2023-10-09 (×2): qty 30, 30d supply, fill #8
  Filled 2023-11-09: qty 30, 30d supply, fill #9
  Filled 2023-12-09: qty 30, 30d supply, fill #10
  Filled 2024-01-08: qty 30, 30d supply, fill #11

## 2023-02-25 DIAGNOSIS — R7301 Impaired fasting glucose: Secondary | ICD-10-CM | POA: Diagnosis not present

## 2023-02-25 DIAGNOSIS — Z0001 Encounter for general adult medical examination with abnormal findings: Secondary | ICD-10-CM | POA: Diagnosis not present

## 2023-02-25 DIAGNOSIS — K2100 Gastro-esophageal reflux disease with esophagitis, without bleeding: Secondary | ICD-10-CM | POA: Diagnosis not present

## 2023-02-25 DIAGNOSIS — E7849 Other hyperlipidemia: Secondary | ICD-10-CM | POA: Diagnosis not present

## 2023-03-05 DIAGNOSIS — G4733 Obstructive sleep apnea (adult) (pediatric): Secondary | ICD-10-CM | POA: Diagnosis not present

## 2023-03-05 DIAGNOSIS — E7849 Other hyperlipidemia: Secondary | ICD-10-CM | POA: Diagnosis not present

## 2023-03-05 DIAGNOSIS — I10 Essential (primary) hypertension: Secondary | ICD-10-CM | POA: Diagnosis not present

## 2023-03-05 DIAGNOSIS — Z23 Encounter for immunization: Secondary | ICD-10-CM | POA: Diagnosis not present

## 2023-03-05 DIAGNOSIS — Q825 Congenital non-neoplastic nevus: Secondary | ICD-10-CM | POA: Diagnosis not present

## 2023-03-05 DIAGNOSIS — Z0001 Encounter for general adult medical examination with abnormal findings: Secondary | ICD-10-CM | POA: Diagnosis not present

## 2023-03-10 ENCOUNTER — Other Ambulatory Visit: Payer: Self-pay

## 2023-03-10 DIAGNOSIS — F17210 Nicotine dependence, cigarettes, uncomplicated: Secondary | ICD-10-CM | POA: Diagnosis not present

## 2023-03-10 DIAGNOSIS — Z0001 Encounter for general adult medical examination with abnormal findings: Secondary | ICD-10-CM | POA: Diagnosis not present

## 2023-04-09 ENCOUNTER — Other Ambulatory Visit (HOSPITAL_COMMUNITY): Payer: Self-pay

## 2023-04-14 DIAGNOSIS — C4441 Basal cell carcinoma of skin of scalp and neck: Secondary | ICD-10-CM | POA: Diagnosis not present

## 2023-04-14 DIAGNOSIS — C4489 Other specified malignant neoplasm of overlapping sites of skin: Secondary | ICD-10-CM | POA: Diagnosis not present

## 2023-04-14 DIAGNOSIS — D485 Neoplasm of uncertain behavior of skin: Secondary | ICD-10-CM | POA: Diagnosis not present

## 2023-05-07 ENCOUNTER — Other Ambulatory Visit: Payer: Self-pay

## 2023-05-13 ENCOUNTER — Other Ambulatory Visit: Payer: Self-pay

## 2023-05-28 DIAGNOSIS — C44619 Basal cell carcinoma of skin of left upper limb, including shoulder: Secondary | ICD-10-CM | POA: Diagnosis not present

## 2023-05-28 DIAGNOSIS — L988 Other specified disorders of the skin and subcutaneous tissue: Secondary | ICD-10-CM | POA: Diagnosis not present

## 2023-05-28 DIAGNOSIS — D485 Neoplasm of uncertain behavior of skin: Secondary | ICD-10-CM | POA: Diagnosis not present

## 2023-05-28 DIAGNOSIS — D1809 Hemangioma of other sites: Secondary | ICD-10-CM | POA: Diagnosis not present

## 2023-06-10 ENCOUNTER — Other Ambulatory Visit (HOSPITAL_COMMUNITY): Payer: Self-pay

## 2023-06-10 ENCOUNTER — Other Ambulatory Visit: Payer: Self-pay

## 2023-06-12 DIAGNOSIS — Z961 Presence of intraocular lens: Secondary | ICD-10-CM | POA: Diagnosis not present

## 2023-06-12 DIAGNOSIS — H31002 Unspecified chorioretinal scars, left eye: Secondary | ICD-10-CM | POA: Diagnosis not present

## 2023-06-12 DIAGNOSIS — H04123 Dry eye syndrome of bilateral lacrimal glands: Secondary | ICD-10-CM | POA: Diagnosis not present

## 2023-06-12 DIAGNOSIS — H26493 Other secondary cataract, bilateral: Secondary | ICD-10-CM | POA: Diagnosis not present

## 2023-06-12 DIAGNOSIS — H524 Presbyopia: Secondary | ICD-10-CM | POA: Diagnosis not present

## 2023-06-12 DIAGNOSIS — H5203 Hypermetropia, bilateral: Secondary | ICD-10-CM | POA: Diagnosis not present

## 2023-06-12 DIAGNOSIS — H52223 Regular astigmatism, bilateral: Secondary | ICD-10-CM | POA: Diagnosis not present

## 2023-06-30 DIAGNOSIS — Z08 Encounter for follow-up examination after completed treatment for malignant neoplasm: Secondary | ICD-10-CM | POA: Diagnosis not present

## 2023-06-30 DIAGNOSIS — D225 Melanocytic nevi of trunk: Secondary | ICD-10-CM | POA: Diagnosis not present

## 2023-06-30 DIAGNOSIS — D1809 Hemangioma of other sites: Secondary | ICD-10-CM | POA: Diagnosis not present

## 2023-06-30 DIAGNOSIS — Z85828 Personal history of other malignant neoplasm of skin: Secondary | ICD-10-CM | POA: Diagnosis not present

## 2023-06-30 DIAGNOSIS — Z1283 Encounter for screening for malignant neoplasm of skin: Secondary | ICD-10-CM | POA: Diagnosis not present

## 2023-06-30 DIAGNOSIS — D485 Neoplasm of uncertain behavior of skin: Secondary | ICD-10-CM | POA: Diagnosis not present

## 2023-07-12 ENCOUNTER — Other Ambulatory Visit (HOSPITAL_COMMUNITY): Payer: Self-pay

## 2023-07-15 ENCOUNTER — Other Ambulatory Visit (HOSPITAL_COMMUNITY): Payer: Self-pay

## 2023-08-09 ENCOUNTER — Other Ambulatory Visit (HOSPITAL_COMMUNITY): Payer: Self-pay

## 2023-09-08 ENCOUNTER — Other Ambulatory Visit (HOSPITAL_COMMUNITY): Payer: Self-pay

## 2023-09-08 ENCOUNTER — Other Ambulatory Visit: Payer: Self-pay

## 2023-09-08 MED ORDER — PRAVASTATIN SODIUM 20 MG PO TABS
20.0000 mg | ORAL_TABLET | Freq: Every day | ORAL | 4 refills | Status: AC
  Filled 2023-09-08: qty 90, 90d supply, fill #0
  Filled 2023-12-09: qty 90, 90d supply, fill #1
  Filled 2024-03-07: qty 90, 90d supply, fill #2
  Filled 2024-06-06: qty 90, 90d supply, fill #3
  Filled 2024-09-03: qty 90, 90d supply, fill #4

## 2023-09-08 MED ORDER — METOPROLOL SUCCINATE ER 100 MG PO TB24
100.0000 mg | ORAL_TABLET | Freq: Every day | ORAL | 4 refills | Status: AC
  Filled 2023-09-08: qty 90, 90d supply, fill #0
  Filled 2023-12-09: qty 90, 90d supply, fill #1
  Filled 2024-03-07: qty 90, 90d supply, fill #2
  Filled 2024-06-06: qty 90, 90d supply, fill #3
  Filled 2024-09-03: qty 90, 90d supply, fill #4

## 2023-10-09 ENCOUNTER — Other Ambulatory Visit (HOSPITAL_COMMUNITY): Payer: Self-pay

## 2023-10-21 ENCOUNTER — Other Ambulatory Visit (HOSPITAL_COMMUNITY): Payer: Self-pay

## 2023-11-10 ENCOUNTER — Other Ambulatory Visit (HOSPITAL_COMMUNITY): Payer: Self-pay

## 2023-12-09 ENCOUNTER — Other Ambulatory Visit: Payer: Self-pay

## 2023-12-10 ENCOUNTER — Other Ambulatory Visit (HOSPITAL_COMMUNITY): Payer: Self-pay

## 2024-01-09 ENCOUNTER — Other Ambulatory Visit (HOSPITAL_COMMUNITY): Payer: Self-pay

## 2024-03-02 DIAGNOSIS — E7849 Other hyperlipidemia: Secondary | ICD-10-CM | POA: Diagnosis not present

## 2024-03-02 DIAGNOSIS — I10 Essential (primary) hypertension: Secondary | ICD-10-CM | POA: Diagnosis not present

## 2024-03-08 ENCOUNTER — Other Ambulatory Visit (HOSPITAL_COMMUNITY): Payer: Self-pay

## 2024-03-09 DIAGNOSIS — Z0000 Encounter for general adult medical examination without abnormal findings: Secondary | ICD-10-CM | POA: Diagnosis not present

## 2024-03-09 DIAGNOSIS — Z1389 Encounter for screening for other disorder: Secondary | ICD-10-CM | POA: Diagnosis not present

## 2024-03-09 DIAGNOSIS — I10 Essential (primary) hypertension: Secondary | ICD-10-CM | POA: Diagnosis not present

## 2024-03-09 DIAGNOSIS — Z0001 Encounter for general adult medical examination with abnormal findings: Secondary | ICD-10-CM | POA: Diagnosis not present

## 2024-03-10 ENCOUNTER — Encounter (INDEPENDENT_AMBULATORY_CARE_PROVIDER_SITE_OTHER): Payer: Self-pay | Admitting: *Deleted

## 2024-03-30 ENCOUNTER — Telehealth: Payer: Self-pay

## 2024-03-30 NOTE — Telephone Encounter (Signed)
 Who is your primary care physician: [facility]  Reasons for the colonoscopy: screening   Have you had a colonoscopy before?  yes  Do you have family history of colon cancer? no  Previous colonoscopy with polyps removed? no  Do you have a history colorectal cancer?   no  Are you diabetic? If yes, Type 1 or Type 2?    no  Do you have a prosthetic or mechanical heart valve? no  Do you have a pacemaker/defibrillator?   no  Have you had endocarditis/atrial fibrillation? no  Have you had joint replacement within the last 12 months?  no  Do you tend to be constipated or have to use laxatives? no  Do you have any history of drugs or alchohol?  Yes 1-2 drinks a week  Do you use supplemental oxygen?  yes  Have you had a stroke or heart attack within the last 6 months? no  Do you take weight loss medication?  no   Do you take any blood-thinning medications such as: (aspirin, warfarin, Plavix, Aggrenox)  no  If yes we need the name, milligram, dosage and who is prescribing doctor  Current Outpatient Medications on File Prior to Visit  Medication Sig Dispense Refill   cetirizine (ZYRTEC) 10 MG tablet Take 10 mg by mouth daily.     cyclobenzaprine (FLEXERIL) 10 MG tablet Take 10 mg by mouth 3 (three) times daily as needed for muscle spasms.     fish oil-omega-3 fatty acids 1000 MG capsule Take 1,000 mg by mouth at bedtime.     losartan  (COZAAR ) 50 MG tablet Take 1 tablet (50 mg total) by mouth daily. 30 tablet 12   metoprolol  succinate (TOPROL -XL) 100 MG 24 hr tablet Take 1 tablet (100 mg total) by mouth daily. 90 tablet 4   pravastatin  (PRAVACHOL ) 20 MG tablet Take 1 tablet (20 mg total) by mouth at bedtime. 90 tablet 4   sildenafil (REVATIO) 20 MG tablet 20 mg.     No current facility-administered medications on file prior to visit.    No Known Allergies   Pharmacy: [facility]   Primary Insurance Name: [facility] [id number]  Best  number where you can be reached: [phone] or [phone]

## 2024-05-05 NOTE — Telephone Encounter (Signed)
 Patient will need an office visit as he reported using supplemental oxygen. Would also be best to find out where his last colonoscopy was performed and get the records so that this can be reviewed at his office visit.

## 2024-05-13 DIAGNOSIS — R202 Paresthesia of skin: Secondary | ICD-10-CM | POA: Diagnosis not present

## 2024-05-13 DIAGNOSIS — E039 Hypothyroidism, unspecified: Secondary | ICD-10-CM | POA: Diagnosis not present

## 2024-05-13 DIAGNOSIS — D519 Vitamin B12 deficiency anemia, unspecified: Secondary | ICD-10-CM | POA: Diagnosis not present

## 2024-05-21 DIAGNOSIS — R292 Abnormal reflex: Secondary | ICD-10-CM | POA: Diagnosis not present

## 2024-05-21 DIAGNOSIS — R200 Anesthesia of skin: Secondary | ICD-10-CM | POA: Diagnosis not present

## 2024-05-21 DIAGNOSIS — M4312 Spondylolisthesis, cervical region: Secondary | ICD-10-CM | POA: Diagnosis not present

## 2024-05-21 DIAGNOSIS — M47812 Spondylosis without myelopathy or radiculopathy, cervical region: Secondary | ICD-10-CM | POA: Diagnosis not present

## 2024-06-03 DIAGNOSIS — M4312 Spondylolisthesis, cervical region: Secondary | ICD-10-CM | POA: Diagnosis not present

## 2024-06-07 ENCOUNTER — Other Ambulatory Visit (HOSPITAL_COMMUNITY): Payer: Self-pay

## 2024-06-10 ENCOUNTER — Encounter: Payer: Self-pay | Admitting: Neurology

## 2024-06-10 ENCOUNTER — Other Ambulatory Visit: Payer: Self-pay

## 2024-06-10 DIAGNOSIS — R202 Paresthesia of skin: Secondary | ICD-10-CM

## 2024-06-23 ENCOUNTER — Other Ambulatory Visit: Payer: Self-pay

## 2024-06-23 ENCOUNTER — Encounter: Payer: Self-pay | Admitting: Gastroenterology

## 2024-06-23 ENCOUNTER — Ambulatory Visit: Admitting: Gastroenterology

## 2024-06-23 ENCOUNTER — Telehealth (INDEPENDENT_AMBULATORY_CARE_PROVIDER_SITE_OTHER): Payer: Self-pay

## 2024-06-23 VITALS — BP 152/78 | HR 54 | Temp 97.2°F | Ht 69.0 in | Wt 209.2 lb

## 2024-06-23 DIAGNOSIS — Z1211 Encounter for screening for malignant neoplasm of colon: Secondary | ICD-10-CM | POA: Diagnosis not present

## 2024-06-23 MED ORDER — PEG 3350-KCL-NA BICARB-NACL 420 G PO SOLR: 4000.0000 mL | Freq: Once | ORAL | 0 refills | Status: AC

## 2024-06-23 MED ORDER — PEG 3350-KCL-NA BICARB-NACL 420 G PO SOLR
4000.0000 mL | Freq: Once | ORAL | 0 refills | Status: DC
  Filled 2024-06-23: qty 4000, 1d supply, fill #0

## 2024-06-23 NOTE — Telephone Encounter (Signed)
 Spoke with patient here in the office, scheduled colonoscopy for 07/07/2024 at 9:00am. Rx sent to pharmacy. Instructions given to patient.

## 2024-06-23 NOTE — Progress Notes (Signed)
 Gastroenterology Office Note    Referring Provider: Atilano Deward ORN, MD Primary Care Physician:  Atilano Deward ORN, MD  Primary GI: Dr. Shaaron    Chief Complaint   Chief Complaint  Patient presents with   Colonoscopy    Pt arrives to schedule colonoscopy. Pt reports he uses a CPAP at night. Last colonoscopy 10 years ago in [place] by Dr.Benson     History of Present Illness   Gregory Medina is a 68 y.o. male presenting today at the request of Dr Deward Atilano to arrange screening colonoscopy. His last colonoscopy was in [place] approximately 10 years ago. No records available at time of visit.   His BP is elevated today initially at 170/76, with recheck 163/80. Recheck manually 152/78. He notes that his BP is typically elevated at office visits due to slight anxiety. No chest pain, SOB, cardiac hx.   No abdominal pain, N/V, changes in bowel habits, constipation, diarrhea, overt GI bleeding, GERD, dysphagia, unexplained weight loss, lack of appetite, unexplained weight gain. He has not needed chronic PPI therapy and denies GERD. No FH colon cancer or polyps.   Numbness/tingling in hands: seeing neurosurgery. MRI without compression. Has nerve conduction study upcoming in December. Possibly dealing with carpal tunnel.     Past Medical History:  Diagnosis Date   History of kidney stones    Hypercholesteremia    Hypertension    Renal disorder    kidney stone   Sleep apnea    CPAP    Past Surgical History:  Procedure Laterality Date   BACK SURGERY     COLONOSCOPY     HERNIA REPAIR Left    INGUINAL HERNIA REPAIR Right 01/24/2021   Procedure: HERNIA REPAIR INGUINAL ADULT W/MESH;  Surgeon: Kallie Manuelita BROCKS, MD;  Location: [facility];  Service: General;  Laterality: Right;    Current Outpatient Medications  Medication Sig Dispense Refill   cetirizine (ZYRTEC) 10 MG tablet Take 10 mg by mouth daily.     cyclobenzaprine (FLEXERIL) 10 MG tablet Take 10 mg by mouth 3 (three) times daily  as needed for muscle spasms.     fish oil-omega-3 fatty acids 1000 MG capsule Take 1,000 mg by mouth at bedtime.     losartan  (COZAAR ) 50 MG tablet Take 1 tablet (50 mg total) by mouth daily. 30 tablet 12   metoprolol  succinate (TOPROL -XL) 100 MG 24 hr tablet Take 1 tablet (100 mg total) by mouth daily. 90 tablet 4   pravastatin  (PRAVACHOL ) 20 MG tablet Take 1 tablet (20 mg total) by mouth at bedtime. 90 tablet 4   sildenafil (REVATIO) 20 MG tablet 20 mg.     No current facility-administered medications for this visit.    Allergies as of 06/23/2024   (No Known Allergies)    Family History  Problem Relation Age of Onset   Hypertension Mother    Heart disease Mother    Multiple myeloma Father    Colon cancer Neg Hx    Colon polyps Neg Hx     Social History   Socioeconomic History   Marital status: Married    Spouse name: Not on file   Number of children: Not on file   Years of education: Not on file   Highest education level: Not on file  Occupational History   Not on file  Tobacco Use   Smoking status: Former    Current packs/day: 0.00    Average packs/day: 1 pack/day for 15.0 years (15.0 ttl  pk-yrs)    Types: Cigarettes    Start date: 01/24/1955    Quit date: 01/23/1970    Years since quitting: 54.4   Smokeless tobacco: Never   Tobacco comments:    quite 40+ years ago  Vaping Use   Vaping status: Never Used  Substance and Sexual Activity   Alcohol use: Yes    Comment: occasionally   Drug use: No   Sexual activity: Yes  Other Topics Concern   Not on file  Social History Narrative   Not on file   Social Drivers of Health   Financial Resource Strain: Not on file  Food Insecurity: Not on file  Transportation Needs: Not on file  Physical Activity: Not on file  Stress: Not on file  Social Connections: Not on file  Intimate Partner Violence: Not on file     Review of Systems   Gen: Denies any fever, chills, fatigue, weight loss, lack of appetite.  CV:  Denies chest pain, heart palpitations, peripheral edema, syncope.  Resp: Denies shortness of breath at rest or with exertion. Denies wheezing or cough.  GI: Denies dysphagia or odynophagia. Denies jaundice, hematemesis, fecal incontinence. GU : Denies urinary burning, urinary frequency, urinary hesitancy MS: Denies joint pain, muscle weakness, cramps, or limitation of movement.  Derm: Denies rash, itching, dry skin Psych: Denies depression, anxiety, memory loss, and confusion Heme: Denies bruising, bleeding, and enlarged lymph nodes.   Physical Exam   BP (!) 152/78   Pulse (!) 54 Comment: rechecked manually during visit  Temp (!) 97.2 F (36.2 C)   Ht 5' 9 (1.753 m)   Wt 209 lb 3.2 oz (94.9 kg)   BMI 30.89 kg/m  General:   Alert and oriented. Pleasant and cooperative. Well-nourished and well-developed.  Head:  Normocephalic and atraumatic. Eyes:  Without icterus Ears:  Normal auditory acuity. Lungs:  Clear to auscultation bilaterally.  Heart:  S1, S2 present without murmurs appreciated.  Abdomen:  +BS, soft, non-tender and non-distended. No HSM noted. No guarding or rebound. No masses appreciated.  Rectal:  Deferred  Msk:  Symmetrical without gross deformities. Normal posture. Extremities:  Without edema. Neurologic:  Alert and  oriented x4;  grossly normal neurologically. Skin:  Intact without significant lesions or rashes. Left arm with birthmark  Psych:  Alert and cooperative. Normal mood and affect.   Assessment   Gregory Medina is a 68 y.o. male presenting today at the request of Dr Deward Soja to arrange screening colonoscopy. His last colonoscopy was in [place] approximately 10 years ago. No records available at time of visit.   He has no concerning lower or upper GI signs/symptoms, no chronic GERD issues, and he has no FH colon cancer or polyps. This will be a routine, average risk screening colonoscopy.  BP initially 170/76, with recheck 163/80. Recheck manually  152/78. Pulse 54. He has hx of white coat syndrome. BPs have run normal at home; he has a BP cuff at home as well. No associated symptoms and no distress.    PLAN   Proceed with colonoscopy by Dr. Shaaron in near future: the risks, benefits, and alternatives have been discussed with the patient in detail. The patient states understanding and desires to proceed. ASA 3  Further recommendations to follow      Therisa MICAEL Stager, PhD, ANP-BC [facility] [place] [facility]

## 2024-06-23 NOTE — Patient Instructions (Signed)
We are arranging a colonoscopy with Dr. Gala Romney in the near future.  Further recommendations to follow!  It was a pleasure to see you today. I want to create trusting relationships with patients and provide genuine, compassionate, and quality care. I truly value your feedback, so please be on the lookout for a survey regarding your visit with me today. I appreciate your time in completing this!    Annitta Needs, PhD, ANP-BC [facility]

## 2024-06-23 NOTE — H&P (View-Only) (Signed)
 Gastroenterology Office Note    Referring Provider: Atilano Deward ORN, MD Primary Care Physician:  Atilano Deward ORN, MD  Primary GI: Dr. Shaaron    Chief Complaint   Chief Complaint  Patient presents with   Colonoscopy    Pt arrives to schedule colonoscopy. Pt reports he uses a CPAP at night. Last colonoscopy 10 years ago in [place] by Dr.Benson     History of Present Illness   Gregory Medina is a 68 y.o. male presenting today at the request of Dr Deward Atilano to arrange screening colonoscopy. His last colonoscopy was in [place] approximately 10 years ago. No records available at time of visit.   His BP is elevated today initially at 170/76, with recheck 163/80. Recheck manually 152/78. He notes that his BP is typically elevated at office visits due to slight anxiety. No chest pain, SOB, cardiac hx.   No abdominal pain, N/V, changes in bowel habits, constipation, diarrhea, overt GI bleeding, GERD, dysphagia, unexplained weight loss, lack of appetite, unexplained weight gain. He has not needed chronic PPI therapy and denies GERD. No FH colon cancer or polyps.   Numbness/tingling in hands: seeing neurosurgery. MRI without compression. Has nerve conduction study upcoming in December. Possibly dealing with carpal tunnel.     Past Medical History:  Diagnosis Date   History of kidney stones    Hypercholesteremia    Hypertension    Renal disorder    kidney stone   Sleep apnea    CPAP    Past Surgical History:  Procedure Laterality Date   BACK SURGERY     COLONOSCOPY     HERNIA REPAIR Left    INGUINAL HERNIA REPAIR Right 01/24/2021   Procedure: HERNIA REPAIR INGUINAL ADULT W/MESH;  Surgeon: Kallie Manuelita BROCKS, MD;  Location: [facility];  Service: General;  Laterality: Right;    Current Outpatient Medications  Medication Sig Dispense Refill   cetirizine (ZYRTEC) 10 MG tablet Take 10 mg by mouth daily.     cyclobenzaprine (FLEXERIL) 10 MG tablet Take 10 mg by mouth 3 (three) times daily  as needed for muscle spasms.     fish oil-omega-3 fatty acids 1000 MG capsule Take 1,000 mg by mouth at bedtime.     losartan  (COZAAR ) 50 MG tablet Take 1 tablet (50 mg total) by mouth daily. 30 tablet 12   metoprolol  succinate (TOPROL -XL) 100 MG 24 hr tablet Take 1 tablet (100 mg total) by mouth daily. 90 tablet 4   pravastatin  (PRAVACHOL ) 20 MG tablet Take 1 tablet (20 mg total) by mouth at bedtime. 90 tablet 4   sildenafil (REVATIO) 20 MG tablet 20 mg.     No current facility-administered medications for this visit.    Allergies as of 06/23/2024   (No Known Allergies)    Family History  Problem Relation Age of Onset   Hypertension Mother    Heart disease Mother    Multiple myeloma Father    Colon cancer Neg Hx    Colon polyps Neg Hx     Social History   Socioeconomic History   Marital status: Married    Spouse name: Not on file   Number of children: Not on file   Years of education: Not on file   Highest education level: Not on file  Occupational History   Not on file  Tobacco Use   Smoking status: Former    Current packs/day: 0.00    Average packs/day: 1 pack/day for 15.0 years (15.0 ttl  pk-yrs)    Types: Cigarettes    Start date: 01/24/1955    Quit date: 01/23/1970    Years since quitting: 54.4   Smokeless tobacco: Never   Tobacco comments:    quite 40+ years ago  Vaping Use   Vaping status: Never Used  Substance and Sexual Activity   Alcohol use: Yes    Comment: occasionally   Drug use: No   Sexual activity: Yes  Other Topics Concern   Not on file  Social History Narrative   Not on file   Social Drivers of Health   Financial Resource Strain: Not on file  Food Insecurity: Not on file  Transportation Needs: Not on file  Physical Activity: Not on file  Stress: Not on file  Social Connections: Not on file  Intimate Partner Violence: Not on file     Review of Systems   Gen: Denies any fever, chills, fatigue, weight loss, lack of appetite.  CV:  Denies chest pain, heart palpitations, peripheral edema, syncope.  Resp: Denies shortness of breath at rest or with exertion. Denies wheezing or cough.  GI: Denies dysphagia or odynophagia. Denies jaundice, hematemesis, fecal incontinence. GU : Denies urinary burning, urinary frequency, urinary hesitancy MS: Denies joint pain, muscle weakness, cramps, or limitation of movement.  Derm: Denies rash, itching, dry skin Psych: Denies depression, anxiety, memory loss, and confusion Heme: Denies bruising, bleeding, and enlarged lymph nodes.   Physical Exam   BP (!) 152/78   Pulse (!) 54 Comment: rechecked manually during visit  Temp (!) 97.2 F (36.2 C)   Ht 5' 9 (1.753 m)   Wt 209 lb 3.2 oz (94.9 kg)   BMI 30.89 kg/m  General:   Alert and oriented. Pleasant and cooperative. Well-nourished and well-developed.  Head:  Normocephalic and atraumatic. Eyes:  Without icterus Ears:  Normal auditory acuity. Lungs:  Clear to auscultation bilaterally.  Heart:  S1, S2 present without murmurs appreciated.  Abdomen:  +BS, soft, non-tender and non-distended. No HSM noted. No guarding or rebound. No masses appreciated.  Rectal:  Deferred  Msk:  Symmetrical without gross deformities. Normal posture. Extremities:  Without edema. Neurologic:  Alert and  oriented x4;  grossly normal neurologically. Skin:  Intact without significant lesions or rashes. Left arm with birthmark  Psych:  Alert and cooperative. Normal mood and affect.   Assessment   Gregory Medina is a 68 y.o. male presenting today at the request of Dr Deward Soja to arrange screening colonoscopy. His last colonoscopy was in [place] approximately 10 years ago. No records available at time of visit.   He has no concerning lower or upper GI signs/symptoms, no chronic GERD issues, and he has no FH colon cancer or polyps. This will be a routine, average risk screening colonoscopy.  BP initially 170/76, with recheck 163/80. Recheck manually  152/78. Pulse 54. He has hx of white coat syndrome. BPs have run normal at home; he has a BP cuff at home as well. No associated symptoms and no distress.    PLAN   Proceed with colonoscopy by Dr. Shaaron in near future: the risks, benefits, and alternatives have been discussed with the patient in detail. The patient states understanding and desires to proceed. ASA 3  Further recommendations to follow      Therisa MICAEL Stager, PhD, ANP-BC [facility] [place] [facility]

## 2024-06-25 NOTE — Telephone Encounter (Signed)
 Spoke with patient, gave pre-op details.

## 2024-06-25 NOTE — Telephone Encounter (Signed)
 PA on [facility] for [facility]: Notification or Prior Authorization is not required for the requested services You are not required to submit a notification/prior authorization based on the information provided.  Decision ID #: [id number]

## 2024-07-02 ENCOUNTER — Encounter (HOSPITAL_COMMUNITY): Payer: Self-pay

## 2024-07-02 ENCOUNTER — Other Ambulatory Visit: Payer: Self-pay

## 2024-07-02 ENCOUNTER — Encounter (HOSPITAL_COMMUNITY)
Admission: RE | Admit: 2024-07-02 | Discharge: 2024-07-02 | Disposition: A | Discharge status: Home / Self Care | Source: Ambulatory Visit | Attending: Internal Medicine | Admitting: Internal Medicine

## 2024-07-07 ENCOUNTER — Ambulatory Visit (HOSPITAL_COMMUNITY): Admitting: Certified Registered"

## 2024-07-07 ENCOUNTER — Ambulatory Visit (HOSPITAL_COMMUNITY)
Admission: RE | Admit: 2024-07-07 | Discharge: 2024-07-07 | Disposition: A | Discharge status: Home / Self Care | Attending: Internal Medicine | Admitting: Internal Medicine

## 2024-07-07 ENCOUNTER — Encounter (HOSPITAL_COMMUNITY)
Admission: RE | Disposition: A | Discharge status: Home / Self Care | Payer: Self-pay | Source: Home / Self Care | Attending: Internal Medicine

## 2024-07-07 ENCOUNTER — Other Ambulatory Visit: Payer: Self-pay

## 2024-07-07 ENCOUNTER — Encounter (HOSPITAL_COMMUNITY): Payer: Self-pay | Admitting: Internal Medicine

## 2024-07-07 DIAGNOSIS — I10 Essential (primary) hypertension: Secondary | ICD-10-CM | POA: Insufficient documentation

## 2024-07-07 DIAGNOSIS — D123 Benign neoplasm of transverse colon: Secondary | ICD-10-CM | POA: Diagnosis not present

## 2024-07-07 DIAGNOSIS — N289 Disorder of kidney and ureter, unspecified: Secondary | ICD-10-CM | POA: Insufficient documentation

## 2024-07-07 DIAGNOSIS — Z1211 Encounter for screening for malignant neoplasm of colon: Secondary | ICD-10-CM

## 2024-07-07 DIAGNOSIS — Z8249 Family history of ischemic heart disease and other diseases of the circulatory system: Secondary | ICD-10-CM | POA: Diagnosis not present

## 2024-07-07 DIAGNOSIS — G4730 Sleep apnea, unspecified: Secondary | ICD-10-CM | POA: Insufficient documentation

## 2024-07-07 DIAGNOSIS — Z87891 Personal history of nicotine dependence: Secondary | ICD-10-CM | POA: Insufficient documentation

## 2024-07-07 HISTORY — PX: COLONOSCOPY: SHX5424

## 2024-07-07 SURGERY — COLONOSCOPY
Anesthesia: General

## 2024-07-07 MED ORDER — LACTATED RINGERS IV SOLN: INTRAVENOUS | Status: DC | PRN

## 2024-07-07 MED ORDER — PROPOFOL 500 MG/50ML IV EMUL
INTRAVENOUS | Status: DC | PRN
  Administered 2024-07-07: 20 mg via INTRAVENOUS
  Administered 2024-07-07: 150 ug/kg/min via INTRAVENOUS
  Administered 2024-07-07: 60 mg via INTRAVENOUS

## 2024-07-07 MED ORDER — EPHEDRINE SULFATE (PRESSORS) 25 MG/5ML IV SOSY
PREFILLED_SYRINGE | INTRAVENOUS | Status: DC | PRN
  Administered 2024-07-07: 10 mg via INTRAVENOUS

## 2024-07-07 MED ORDER — LIDOCAINE 2% (20 MG/ML) 5 ML SYRINGE
INTRAMUSCULAR | Status: DC | PRN
  Administered 2024-07-07: 100 mg via INTRAVENOUS

## 2024-07-07 NOTE — Anesthesia Postprocedure Evaluation (Signed)
 Anesthesia Post Note  Patient: CHUKWUKA FESTA  Procedure(s) Performed: COLONOSCOPY  Patient location during evaluation: PACU Anesthesia Type: General Level of consciousness: awake and alert Pain management: pain level controlled Vital Signs Assessment: post-procedure vital signs reviewed and stable Respiratory status: spontaneous breathing, nonlabored ventilation, respiratory function stable and patient connected to nasal cannula oxygen Cardiovascular status: blood pressure returned to baseline and stable Postop Assessment: no apparent nausea or vomiting Anesthetic complications: no   No notable events documented.   Last Vitals:  Vitals:   07/07/24 0800 07/07/24 0942  BP: (!) 163/89 119/73  Pulse: (!) 57 68  Resp: 16 12  Temp: 36.6 C 36.6 C  SpO2: 99% 98%    Last Pain:  Vitals:   07/07/24 0942  TempSrc: Oral  PainSc: 0-No pain                 Andrea Limes

## 2024-07-07 NOTE — Anesthesia Preprocedure Evaluation (Signed)
 Anesthesia Evaluation  Patient identified by MRN, date of birth, ID band Patient awake    Reviewed: Allergy & Precautions, H&P , NPO status , Patient's Chart, lab work & pertinent test results  Airway Mallampati: II  TM Distance: >3 FB Neck ROM: Full    Dental no notable dental hx.    Pulmonary sleep apnea , former smoker   Pulmonary exam normal breath sounds clear to auscultation       Cardiovascular hypertension, Normal cardiovascular exam Rhythm:Regular Rate:Normal     Neuro/Psych negative neurological ROS  negative psych ROS   GI/Hepatic negative GI ROS, Neg liver ROS,,,  Endo/Other  negative endocrine ROS    Renal/GU Renal disease  negative genitourinary   Musculoskeletal negative musculoskeletal ROS (+)    Abdominal   Peds negative pediatric ROS (+)  Hematology negative hematology ROS (+)   Anesthesia Other Findings   Reproductive/Obstetrics negative OB ROS                              Anesthesia Physical Anesthesia Plan  ASA: 2  Anesthesia Plan: General   Post-op Pain Management:    Induction: Intravenous  PONV Risk Score and Plan:   Airway Management Planned: Nasal Cannula  Additional Equipment:   Intra-op Plan:   Post-operative Plan:   Informed Consent: I have reviewed the patients History and Physical, chart, labs and discussed the procedure including the risks, benefits and alternatives for the proposed anesthesia with the patient or authorized representative who has indicated his/her understanding and acceptance.     Dental advisory given  Plan Discussed with: CRNA  Anesthesia Plan Comments:         Anesthesia Quick Evaluation

## 2024-07-07 NOTE — Transfer of Care (Signed)
 Immediate Anesthesia Transfer of Care Note  Patient: Gregory Medina  Procedure(s) Performed: COLONOSCOPY  Patient Location: Short Stay  Anesthesia Type:General  Level of Consciousness: awake, alert , and oriented  Airway & Oxygen Therapy: Patient Spontanous Breathing  Post-op Assessment: Report given to RN and Post -op Vital signs reviewed and stable  Post vital signs: Reviewed and stable  Last Vitals:  Vitals Value Taken Time  BP 119/73 07/07/24 09:42  Temp 36.6 C 07/07/24 09:42  Pulse 68 07/07/24 09:42  Resp 12 07/07/24 09:42  SpO2 98 % 07/07/24 09:42    Last Pain:  Vitals:   07/07/24 0942  TempSrc: Oral  PainSc: 0-No pain      Patients Stated Pain Goal: 4 (07/07/24 0800)  Complications: No notable events documented.

## 2024-07-07 NOTE — Interval H&P Note (Signed)
 History and Physical Interval Note:  07/07/2024 9:03 AM  Gregory Medina  has presented today for surgery, with the diagnosis of screening.  The various methods of treatment have been discussed with the patient and family. After consideration of risks, benefits and other options for treatment, the patient has consented to  Procedure(s) with comments: COLONOSCOPY (N/A) - 9:00am, ASA 3 as a surgical intervention.  The patient's history has been reviewed, patient examined, no change in status, stable for surgery.  I have reviewed the patient's chart and labs.  Questions were answered to the patient's satisfaction.     Eliot Bencivenga   no change.  Patient here for average risk screening colonoscopy.  The risks, benefits, limitations, alternatives and imponderables have been reviewed with the patient. Questions have been answered. All parties are agreeable.

## 2024-07-07 NOTE — Discharge Instructions (Signed)
  Colonoscopy Discharge Instructions  Read the instructions outlined below and refer to this sheet in the next few weeks. These discharge instructions provide you with general information on caring for yourself after you leave the hospital. Your doctor may also give you specific instructions. While your treatment has been planned according to the most current medical practices available, unavoidable complications occasionally occur. If you have any problems or questions after discharge, call Dr. Shaaron at [phone]. ACTIVITY You may resume your regular activity, but move at a slower pace for the next 24 hours.  Take frequent rest periods for the next 24 hours.  Walking will help get rid of the air and reduce the bloated feeling in your belly (abdomen).  No driving for 24 hours (because of the medicine (anesthesia) used during the test).   Do not sign any important legal documents or operate any machinery for 24 hours (because of the anesthesia used during the test).  NUTRITION Drink plenty of fluids.  You may resume your normal diet as instructed by your doctor.  Begin with a light meal and progress to your normal diet. Heavy or fried foods are harder to digest and may make you feel sick to your stomach (nauseated).  Avoid alcoholic beverages for 24 hours or as instructed.  MEDICATIONS You may resume your normal medications unless your doctor tells you otherwise.  WHAT YOU CAN EXPECT TODAY Some feelings of bloating in the abdomen.  Passage of more gas than usual.  Spotting of blood in your stool or on the toilet paper.  IF YOU HAD POLYPS REMOVED DURING THE COLONOSCOPY: No aspirin products for 7 days or as instructed.  No alcohol for 7 days or as instructed.  Eat a soft diet for the next 24 hours.  FINDING OUT THE RESULTS OF YOUR TEST Not all test results are available during your visit. If your test results are not back during the visit, make an appointment with your caregiver to find out the  results. Do not assume everything is normal if you have not heard from your caregiver or the medical facility. It is important for you to follow up on all of your test results.  SEEK IMMEDIATE MEDICAL ATTENTION IF: You have more than a spotting of blood in your stool.  Your belly is swollen (abdominal distention).  You are nauseated or vomiting.  You have a temperature over 101.  You have abdominal pain or discomfort that is severe or gets worse throughout the day.    3 small polyps found and removed  Further recommendations to follow pending review of pathology report  At patient request, called Peggye Rung at [phone].  Reviewed findings and recommendations

## 2024-07-07 NOTE — Op Note (Signed)
 [facility] Patient Name: Gregory Medina Procedure Date: 07/07/2024 8:58 AM MRN: [id number] Date of Birth: [birth year] Attending MD: Lamar Ozell Hollingshead , MD, [id number] CSN: [id number] Age: 68 Admit Type: Outpatient Procedure:                Colonoscopy Indications:              Screening for colorectal malignant neoplasm Providers:                Lamar Ozell Hollingshead, MD, Madelin Hunter, RN, Harlene Lips Referring MD:              Medicines:                Propofol  per Anesthesia Complications:            No immediate complications. Estimated Blood Loss:     Estimated blood loss was minimal. Procedure:                Pre-Anesthesia Assessment:                           - Prior to the procedure, a History and Physical                            was performed, and patient medications and                            allergies were reviewed. The patient's tolerance of                            previous anesthesia was also reviewed. The risks                            and benefits of the procedure and the sedation                            options and risks were discussed with the patient.                            All questions were answered, and informed consent                            was obtained. Prior Anticoagulants: The patient has                            taken no anticoagulant or antiplatelet agents. ASA                            Grade Assessment: III - A patient with severe                            systemic disease. After reviewing the risks and  benefits, the patient was deemed in satisfactory                            condition to undergo the procedure.                           After obtaining informed consent, the colonoscope                            was passed under direct vision. Throughout the                            procedure, the patient's blood pressure, pulse, and                             oxygen saturations were monitored continuously. The                            [id number] ([id number]) Colon was introduced through                            the anus and advanced to the the cecum, identified                            by appendiceal orifice and ileocecal valve. The                            colonoscopy was performed without difficulty. The                            patient tolerated the procedure well. The quality                            of the bowel preparation was adequate. The                            ileocecal valve, appendiceal orifice, and rectum                            were photographed. Scope In: 9:19:40 AM Scope Out: 9:35:11 AM Scope Withdrawal Time: 0 hours 7 minutes 36 seconds  Total Procedure Duration: 0 hours 15 minutes 31 seconds  Findings:      The perianal and digital rectal examinations were normal.      Three semi-pedunculated polyps were found in the hepatic flexure. The       polyps were 4 to 5 mm in size. These polyps were removed with a cold       snare. Resection and retrieval were complete. Estimated blood loss was       minimal.      The exam was otherwise without abnormality on direct and retroflexion       views. Impression:               - Three 4 to 5 mm polyps at the hepatic flexure,  removed with a cold snare. Resected and retrieved.                           - The examination was otherwise normal on direct                            and retroflexion views. Moderate Sedation:      Moderate (conscious) sedation was personally administered by an       anesthesia professional. The following parameters were monitored: oxygen       saturation, heart rate, blood pressure, respiratory rate, EKG, adequacy       of pulmonary ventilation, and response to care. Recommendation:           - Patient has a contact number available for                            emergencies. The signs and symptoms of potential                             delayed complications were discussed with the                            patient. Return to normal activities tomorrow.                            Written discharge instructions were provided to the                            patient.                           - Advance diet as tolerated.                           - Continue present medications.                           - Repeat colonoscopy date to be determined after                            pending pathology results are reviewed for                            surveillance.                           - Return to GI office (date not yet determined). Procedure Code(s):        --- Professional ---                           [phone], Colonoscopy, flexible; with removal of                            tumor(s), polyp(s), or other lesion(s) by snare  technique Diagnosis Code(s):        --- Professional ---                           Z12.11, Encounter for screening for malignant                            neoplasm of colon                           D12.3, Benign neoplasm of transverse colon (hepatic                            flexure or splenic flexure) CPT copyright 2022 [employer]. All rights reserved. The codes documented in this report are preliminary and upon coder review may  be revised to meet current compliance requirements. Lamar HERO. Azavion Bouillon, MD Lamar Ozell Hollingshead, MD 07/07/2024 9:48:23 AM This report has been signed electronically. Number of Addenda: 0

## 2024-07-09 ENCOUNTER — Encounter (HOSPITAL_COMMUNITY): Payer: Self-pay | Admitting: Internal Medicine

## 2024-07-09 LAB — SURGICAL PATHOLOGY

## 2024-07-12 ENCOUNTER — Other Ambulatory Visit (HOSPITAL_BASED_OUTPATIENT_CLINIC_OR_DEPARTMENT_OTHER): Payer: Self-pay

## 2024-07-16 ENCOUNTER — Ambulatory Visit: Payer: Self-pay | Admitting: Internal Medicine

## 2024-08-20 ENCOUNTER — Ambulatory Visit: Admitting: Neurology

## 2024-08-20 DIAGNOSIS — R202 Paresthesia of skin: Secondary | ICD-10-CM

## 2024-08-20 DIAGNOSIS — G5603 Carpal tunnel syndrome, bilateral upper limbs: Secondary | ICD-10-CM

## 2024-08-20 NOTE — Procedures (Signed)
 [facility]  [address] [place], [address]  [place], [place] [address] Tel: [phone] Fax: [phone] Test Date:  08/20/2024  Patient: Gregory Medina DOB: 09-Jul-1956 Physician: Tonita Blanch, DO  Sex: Male Height: 5' 9 Ref Phys: Reyes Budge, MD  ID#: [id number]   Technician:    History: This is a 68 year old man referred for evaluation of bilateral upper extremity paresthesias and weakness.  NCV & EMG Findings: Extensive electrodiagnostic testing of the right upper extremity and additional studies of the left shows:  Right median sensory response is absent.  Left median sensory response shows prolonged latency (3.9 ms) and reduced amplitude (7.8 V).  Bilateral ulnar sensory responses are within normal limits. Right median motor response is absent; of note, there is a right Martin-Gruber anastomosis, a normal anatomic variant.  Left median and bilateral ulnar motor responses are within normal limits. Despite maximal activation, no motor unit recruitment was seen in the right abductor pollicis brevis muscle.  There is no evidence of accompanying active denervation.  The remaining tested muscles showed normal motor unit configuration and recruitment pattern.  Impression: Bilateral median neuropathy at or distal to the wrist, consistent with a clinical diagnosis of carpal tunnel syndrome.  Overall, these findings are severe on the right and moderate on the left. There is no evidence of a cervical radiculopathy affecting the upper extremities.   ___________________________ Tonita Blanch, DO    Nerve Conduction Studies   Stim Site NR Peak (ms) Norm Peak (ms) O-P Amp (V) Norm O-P Amp  Left Median Anti Sensory (2nd Digit)  32 C  Wrist    *3.9 <3.8 *7.8 >10  Right Median Anti Sensory (2nd Digit)  32 C  Wrist *NR  <3.8  >10  Left Ulnar Anti Sensory (5th Digit)  32 C  Wrist    3.0 <3.2 6.5 >5  Right Ulnar Anti Sensory (5th Digit)  32 C  Wrist    2.6 <3.2 6.0 >5      Stim Site NR Onset (ms) Norm Onset (ms) O-P Amp (mV) Norm O-P Amp Site1 Site2 Delta-0 (ms) Dist (cm) Vel (m/s) Norm Vel (m/s)  Left Median Motor (Abd Poll Brev)  32 C  Wrist    3.2 <4.0 7.2 >5 Elbow Wrist 6.3 33.0 52 >50  Elbow    9.5  6.8         Right Median Motor (Abd Poll Brev)  32 C  Wrist *NR  <4.0  >5 Elbow Wrist  34.0  >50  Elbow *NR     Ulnar-wrist crossover Elbow  0.0    Ulnar-wrist crossover    4.6  3.0         Left Ulnar Motor (Abd Dig Minimi)  32 C  Wrist    1.7 <3.1 7.7 >7 B Elbow Wrist 5.0 25.0 50 >50  B Elbow    6.7  7.0  A Elbow B Elbow 1.9 10.0 53 >50  A Elbow    8.6  6.6         Right Ulnar Motor (Abd Dig Minimi)  32 C  Wrist    2.2 <3.1 9.1 >7 B Elbow Wrist 3.9 23.0 59 >50  B Elbow    6.1  8.7  A Elbow B Elbow 1.6 10.0 62 >50  A Elbow    7.7  8.4          Electromyography   Side Muscle Ins.Act Fibs Fasc Recrt Amp Dur Poly Activation Comment  Right 1stDorInt Nml Nml  Nml Nml Nml Nml Nml Nml N/A  Right Abd Poll Brev Nml Nml Nml *None - - - Nml ATR  Right PronatorTeres Nml Nml Nml Nml Nml Nml Nml Nml N/A  Right Biceps Nml Nml Nml Nml Nml Nml Nml Nml N/A  Right Triceps Nml Nml Nml Nml Nml Nml Nml Nml N/A  Right Deltoid Nml Nml Nml Nml Nml Nml Nml Nml N/A  Left 1stDorInt Nml Nml Nml Nml Nml Nml Nml Nml N/A  Left Abd Poll Brev Nml Nml Nml Nml Nml Nml Nml Nml N/A  Left PronatorTeres Nml Nml Nml Nml Nml Nml Nml Nml N/A  Left Biceps Nml Nml Nml Nml Nml Nml Nml Nml N/A  Left Triceps Nml Nml Nml Nml Nml Nml Nml Nml N/A  Left Deltoid Nml Nml Nml Nml Nml Nml Nml Nml N/A      Waveforms:

## 2024-09-03 ENCOUNTER — Other Ambulatory Visit (HOSPITAL_COMMUNITY): Payer: Self-pay

## 2024-09-08 ENCOUNTER — Other Ambulatory Visit (HOSPITAL_COMMUNITY): Payer: Self-pay
